# Patient Record
Sex: Female | Born: 1975 | Race: White | Hispanic: No | Marital: Married | Smoking: Never smoker
Health system: Southern US, Community
[De-identification: ages and names within clinical notes are randomized; demographics above are authoritative.]

## PROBLEM LIST (undated history)

## (undated) DIAGNOSIS — F419 Anxiety disorder, unspecified: Secondary | ICD-10-CM

## (undated) DIAGNOSIS — N809 Endometriosis, unspecified: Secondary | ICD-10-CM

## (undated) DIAGNOSIS — K469 Unspecified abdominal hernia without obstruction or gangrene: Secondary | ICD-10-CM

## (undated) HISTORY — PX: BUNIONECTOMY: SHX129

## (undated) HISTORY — PX: ABDOMINAL HYSTERECTOMY: SHX81

## (undated) HISTORY — DX: Endometriosis, unspecified: N80.9

## (undated) HISTORY — PX: OOPHORECTOMY: SHX86

## (undated) HISTORY — DX: Anxiety disorder, unspecified: F41.9

---

## 2015-11-30 ENCOUNTER — Other Ambulatory Visit: Payer: Self-pay | Admitting: Adult Health

## 2015-11-30 DIAGNOSIS — Z1231 Encounter for screening mammogram for malignant neoplasm of breast: Secondary | ICD-10-CM

## 2015-12-28 ENCOUNTER — Ambulatory Visit: Payer: Self-pay

## 2016-10-31 ENCOUNTER — Emergency Department: Payer: BC Managed Care – PPO

## 2016-10-31 ENCOUNTER — Encounter: Payer: Self-pay | Admitting: *Deleted

## 2016-10-31 ENCOUNTER — Observation Stay
Admission: EM | Admit: 2016-10-31 | Discharge: 2016-11-04 | Disposition: A | Discharge status: Home / Self Care | Payer: BC Managed Care – PPO | Attending: Internal Medicine | Admitting: Internal Medicine

## 2016-10-31 DIAGNOSIS — Z842 Family history of other diseases of the genitourinary system: Secondary | ICD-10-CM | POA: Diagnosis not present

## 2016-10-31 DIAGNOSIS — N83209 Unspecified ovarian cyst, unspecified side: Secondary | ICD-10-CM

## 2016-10-31 DIAGNOSIS — N83202 Unspecified ovarian cyst, left side: Secondary | ICD-10-CM | POA: Diagnosis not present

## 2016-10-31 DIAGNOSIS — D72829 Elevated white blood cell count, unspecified: Secondary | ICD-10-CM | POA: Diagnosis present

## 2016-10-31 DIAGNOSIS — N938 Other specified abnormal uterine and vaginal bleeding: Secondary | ICD-10-CM | POA: Insufficient documentation

## 2016-10-31 DIAGNOSIS — B9689 Other specified bacterial agents as the cause of diseases classified elsewhere: Secondary | ICD-10-CM | POA: Diagnosis not present

## 2016-10-31 DIAGNOSIS — D251 Intramural leiomyoma of uterus: Secondary | ICD-10-CM | POA: Diagnosis not present

## 2016-10-31 DIAGNOSIS — R1031 Right lower quadrant pain: Principal | ICD-10-CM | POA: Insufficient documentation

## 2016-10-31 DIAGNOSIS — Z91048 Other nonmedicinal substance allergy status: Secondary | ICD-10-CM | POA: Insufficient documentation

## 2016-10-31 DIAGNOSIS — E876 Hypokalemia: Secondary | ICD-10-CM | POA: Insufficient documentation

## 2016-10-31 DIAGNOSIS — N760 Acute vaginitis: Secondary | ICD-10-CM | POA: Insufficient documentation

## 2016-10-31 DIAGNOSIS — R935 Abnormal findings on diagnostic imaging of other abdominal regions, including retroperitoneum: Secondary | ICD-10-CM

## 2016-10-31 DIAGNOSIS — K589 Irritable bowel syndrome without diarrhea: Secondary | ICD-10-CM | POA: Diagnosis not present

## 2016-10-31 DIAGNOSIS — Z8719 Personal history of other diseases of the digestive system: Secondary | ICD-10-CM | POA: Insufficient documentation

## 2016-10-31 DIAGNOSIS — Z8659 Personal history of other mental and behavioral disorders: Secondary | ICD-10-CM | POA: Diagnosis not present

## 2016-10-31 DIAGNOSIS — Z8249 Family history of ischemic heart disease and other diseases of the circulatory system: Secondary | ICD-10-CM | POA: Insufficient documentation

## 2016-10-31 DIAGNOSIS — Z9889 Other specified postprocedural states: Secondary | ICD-10-CM | POA: Diagnosis not present

## 2016-10-31 DIAGNOSIS — R109 Unspecified abdominal pain: Secondary | ICD-10-CM | POA: Diagnosis present

## 2016-10-31 DIAGNOSIS — R933 Abnormal findings on diagnostic imaging of other parts of digestive tract: Secondary | ICD-10-CM

## 2016-10-31 DIAGNOSIS — Z803 Family history of malignant neoplasm of breast: Secondary | ICD-10-CM | POA: Diagnosis not present

## 2016-10-31 LAB — URINALYSIS, COMPLETE (UACMP) WITH MICROSCOPIC
BACTERIA UA: NONE SEEN
BILIRUBIN URINE: NEGATIVE
Glucose, UA: NEGATIVE mg/dL
Ketones, ur: 20 mg/dL — AB
LEUKOCYTES UA: NEGATIVE
NITRITE: NEGATIVE
PH: 5 (ref 5.0–8.0)
Protein, ur: 30 mg/dL — AB
SPECIFIC GRAVITY, URINE: 1.018 (ref 1.005–1.030)

## 2016-10-31 LAB — COMPREHENSIVE METABOLIC PANEL
ALT: 19 U/L (ref 14–54)
AST: 15 U/L (ref 15–41)
Albumin: 4 g/dL (ref 3.5–5.0)
Alkaline Phosphatase: 33 U/L — ABNORMAL LOW (ref 38–126)
Anion gap: 6 (ref 5–15)
BUN: 9 mg/dL (ref 6–20)
CHLORIDE: 107 mmol/L (ref 101–111)
CO2: 24 mmol/L (ref 22–32)
CREATININE: 0.52 mg/dL (ref 0.44–1.00)
Calcium: 8.6 mg/dL — ABNORMAL LOW (ref 8.9–10.3)
GFR calc Af Amer: >60 mL/min (ref >60)
GFR calc non Af Amer: >60 mL/min (ref >60)
Glucose, Bld: 116 mg/dL — ABNORMAL HIGH (ref 65–99)
Potassium: 3.4 mmol/L — ABNORMAL LOW (ref 3.5–5.1)
Sodium: 137 mmol/L (ref 135–145)
Total Bilirubin: 1.3 mg/dL — ABNORMAL HIGH (ref 0.3–1.2)
Total Protein: 6.1 g/dL — ABNORMAL LOW (ref 6.5–8.1)

## 2016-10-31 LAB — WET PREP, GENITAL
Sperm: NONE SEEN
TRICH WET PREP: NONE SEEN
YEAST WET PREP: NONE SEEN

## 2016-10-31 LAB — CBC
HEMATOCRIT: 39.4 % (ref 35.0–47.0)
Hemoglobin: 13.9 g/dL (ref 12.0–16.0)
MCH: 31.7 pg (ref 26.0–34.0)
MCHC: 35.2 g/dL (ref 32.0–36.0)
MCV: 90.1 fL (ref 80.0–100.0)
Platelets: 270 10*3/uL (ref 150–440)
RBC: 4.37 MIL/uL (ref 3.80–5.20)
RDW: 12.3 % (ref 11.5–14.5)
WBC: 18.3 10*3/uL — AB (ref 3.6–11.0)

## 2016-10-31 LAB — CHLAMYDIA/NGC RT PCR (ARMC ONLY)
Chlamydia Tr: NOT DETECTED
N GONORRHOEAE: NOT DETECTED

## 2016-10-31 LAB — LIPASE, BLOOD: LIPASE: 15 U/L (ref 11–51)

## 2016-10-31 LAB — PREGNANCY, URINE: PREG TEST UR: NEGATIVE

## 2016-10-31 LAB — TSH: TSH: 0.894 u[IU]/mL (ref 0.350–4.500)

## 2016-10-31 MED ORDER — MORPHINE SULFATE (PF) 4 MG/ML IV SOLN
4.0000 mg | INTRAVENOUS | Status: DC | PRN
  Administered 2016-10-31 – 2016-11-01 (×3): 4 mg via INTRAVENOUS
  Filled 2016-10-31 (×3): qty 1

## 2016-10-31 MED ORDER — ONDANSETRON HCL 4 MG/2ML IJ SOLN
4.0000 mg | Freq: Once | INTRAMUSCULAR | Status: AC
  Administered 2016-10-31: 4 mg via INTRAVENOUS
  Filled 2016-10-31: qty 2

## 2016-10-31 MED ORDER — METRONIDAZOLE 500 MG PO TABS
500.0000 mg | ORAL_TABLET | Freq: Two times a day (BID) | ORAL | Status: DC
  Administered 2016-11-01: 500 mg via ORAL
  Filled 2016-10-31 (×3): qty 1

## 2016-10-31 MED ORDER — ACETAMINOPHEN 325 MG PO TABS
650.0000 mg | ORAL_TABLET | Freq: Four times a day (QID) | ORAL | Status: DC | PRN
  Administered 2016-11-01: 650 mg via ORAL
  Filled 2016-10-31: qty 2

## 2016-10-31 MED ORDER — IOPAMIDOL (ISOVUE-300) INJECTION 61%
100.0000 mL | Freq: Once | INTRAVENOUS | Status: DC | PRN
  Filled 2016-10-31: qty 100

## 2016-10-31 MED ORDER — FENTANYL CITRATE (PF) 100 MCG/2ML IJ SOLN
100.0000 ug | INTRAMUSCULAR | Status: DC | PRN
  Administered 2016-10-31: 100 ug via INTRAVENOUS
  Filled 2016-10-31: qty 2

## 2016-10-31 MED ORDER — PROMETHAZINE HCL 25 MG/ML IJ SOLN
12.5000 mg | Freq: Four times a day (QID) | INTRAMUSCULAR | Status: DC | PRN
  Administered 2016-10-31: 12.5 mg via INTRAVENOUS

## 2016-10-31 MED ORDER — METRONIDAZOLE 500 MG PO TABS: 500.0000 mg | ORAL_TABLET | Freq: Two times a day (BID) | ORAL | 0 refills | Status: DC

## 2016-10-31 MED ORDER — ONDANSETRON HCL 4 MG PO TABS: 4.0000 mg | ORAL_TABLET | Freq: Four times a day (QID) | ORAL | Status: DC | PRN

## 2016-10-31 MED ORDER — ONDANSETRON HCL 4 MG/2ML IJ SOLN
4.0000 mg | Freq: Four times a day (QID) | INTRAMUSCULAR | Status: DC | PRN
  Administered 2016-11-01 – 2016-11-03 (×2): 4 mg via INTRAVENOUS
  Filled 2016-10-31 (×2): qty 2

## 2016-10-31 MED ORDER — PROMETHAZINE HCL 25 MG/ML IJ SOLN
12.5000 mg | Freq: Four times a day (QID) | INTRAMUSCULAR | Status: DC | PRN
  Administered 2016-10-31: 12.5 mg via INTRAVENOUS
  Filled 2016-10-31 (×2): qty 1

## 2016-10-31 MED ORDER — ACETAMINOPHEN 650 MG RE SUPP
650.0000 mg | Freq: Four times a day (QID) | RECTAL | Status: DC | PRN
Start: 2016-10-31 — End: 2016-11-04

## 2016-10-31 MED ORDER — SODIUM CHLORIDE 0.9 % IV BOLUS (SEPSIS)
1000.0000 mL | Freq: Once | INTRAVENOUS | Status: AC
  Administered 2016-10-31: 1000 mL via INTRAVENOUS

## 2016-10-31 MED ORDER — MESALAMINE ER 250 MG PO CPCR
1000.0000 mg | ORAL_CAPSULE | Freq: Four times a day (QID) | ORAL | Status: DC
  Filled 2016-10-31 (×3): qty 4

## 2016-10-31 MED ORDER — DOCUSATE SODIUM 100 MG PO CAPS
100.0000 mg | ORAL_CAPSULE | Freq: Two times a day (BID) | ORAL | Status: DC
  Administered 2016-11-01: 100 mg via ORAL
  Filled 2016-10-31 (×5): qty 1

## 2016-10-31 MED ORDER — HYDROCODONE-ACETAMINOPHEN 5-325 MG PO TABS: 1.0000 | ORAL_TABLET | ORAL | 0 refills | Status: DC | PRN

## 2016-10-31 MED ORDER — PROMETHAZINE HCL 12.5 MG PO TABS: 12.5000 mg | ORAL_TABLET | Freq: Four times a day (QID) | ORAL | 0 refills | Status: DC | PRN

## 2016-10-31 NOTE — ED Triage Notes (Signed)
Pt states lower abd pain that radiates to her back that began today, denies any nausea or vomiting or problems with BM, states she is currently on her menstrual cycle

## 2016-10-31 NOTE — ED Notes (Signed)
ED Provider at bedside. 

## 2016-10-31 NOTE — ED Provider Notes (Addendum)
[facility] Emergency Department Provider Note    None    (approximate)  I have reviewed the triage vital signs and the nursing notes.   HISTORY  Chief Complaint Abdominal Pain    HPI Shelly Middleton is a 41 y.o. female presents with chief complaint of sudden onset right lower quadrant stabbing pain. Also describes a pressure. Denies any shortness of breath or chest pain. No nausea or vomiting. Did take 1 ibuprofen earlier in the day and denies any improvement in symptoms. Has never had pain like this before. No previous surgeries. Is currently on her menstrual cycle.  Denies any chance of being pregnant. No previous history of cysts. No vaginal discharge but is having some abnormal vaginal bleeding.   History reviewed. No pertinent past medical history. History reviewed. No pertinent family history. History reviewed. No pertinent surgical history. There are no active problems to display for this patient.     Prior to Admission medications   Medication Sig Start Date End Date Taking? Authorizing Provider  HYDROcodone-acetaminophen (NORCO) 5-325 MG tablet Take 1 tablet by mouth every 4 (four) hours as needed for moderate pain. 10/31/16   Merlyn Lot, MD  metroNIDAZOLE (FLAGYL) 500 MG tablet Take 1 tablet (500 mg total) by mouth 2 (two) times daily. 10/31/16 11/07/16  Merlyn Lot, MD  promethazine (PHENERGAN) 12.5 MG tablet Take 1 tablet (12.5 mg total) by mouth every 6 (six) hours as needed for nausea or vomiting. 10/31/16   Merlyn Lot, MD    Allergies Patient has no known allergies.    Social History Social History  Substance Use Topics  . Smoking status: Never Smoker  . Smokeless tobacco: Never Used  . Alcohol use No    Review of Systems Patient denies headaches, rhinorrhea, blurry vision, numbness, shortness of breath, chest pain, edema, cough, abdominal pain, nausea, vomiting, diarrhea, dysuria, fevers, rashes or hallucinations  unless otherwise stated above in HPI. ____________________________________________   PHYSICAL EXAM:  VITAL SIGNS: Vitals:   10/31/16 1343  BP: 127/87  Pulse: 89  Resp: 18  Temp: 98.3 F (36.8 C)  SpO2: 100%    Constitutional: Alert and oriented. Well appearing and in no acute distress. Eyes: Conjunctivae are normal.  Head: Atraumatic. Nose: No congestion/rhinnorhea. Mouth/Throat: Mucous membranes are moist.   Neck: No stridor. Painless ROM.  Cardiovascular: Normal rate, regular rhythm. Grossly normal heart sounds.  Good peripheral circulation. Respiratory: Normal respiratory effort.  No retractions. Lungs CTAB. Gastrointestinal: Soft and nontender. No distention. No abdominal bruits. No CVA tenderness. Genitourinary: no cmt, moderate blood in vaginal vault without evidence of purulent discharge Musculoskeletal: No lower extremity tenderness nor edema.  No joint effusions. Neurologic:  Normal speech and language. No gross focal neurologic deficits are appreciated. No facial droop Skin:  Skin is warm, dry and intact. No rash noted. Psychiatric: Mood and affect are normal. Speech and behavior are normal.  ____________________________________________   LABS (all labs ordered are listed, but only abnormal results are displayed)  Results for orders placed or performed during the hospital encounter of 10/31/16 (from the past 24 hour(s))  Lipase, blood     Status: None   Collection Time: 10/31/16  1:42 PM  Result Value Ref Range   Lipase 15 11 - 51 U/L  Comprehensive metabolic panel     Status: Abnormal   Collection Time: 10/31/16  1:42 PM  Result Value Ref Range   Sodium 137 135 - 145 mmol/L   Potassium 3.4 (L) 3.5 - 5.1 mmol/L  Chloride 107 101 - 111 mmol/L   CO2 24 22 - 32 mmol/L   Glucose, Bld 116 (H) 65 - 99 mg/dL   BUN 9 6 - 20 mg/dL   Creatinine, Ser 0.52 0.44 - 1.00 mg/dL   Calcium 8.6 (L) 8.9 - 10.3 mg/dL   Total Protein 6.1 (L) 6.5 - 8.1 g/dL   Albumin 4.0 3.5  - 5.0 g/dL   AST 15 15 - 41 U/L   ALT 19 14 - 54 U/L   Alkaline Phosphatase 33 (L) 38 - 126 U/L   Total Bilirubin 1.3 (H) 0.3 - 1.2 mg/dL   GFR calc non Af Amer >60 >60 mL/min   GFR calc Af Amer >60 >60 mL/min   Anion gap 6 5 - 15  CBC     Status: Abnormal   Collection Time: 10/31/16  1:42 PM  Result Value Ref Range   WBC 18.3 (H) 3.6 - 11.0 K/uL   RBC 4.37 3.80 - 5.20 MIL/uL   Hemoglobin 13.9 12.0 - 16.0 g/dL   HCT 39.4 35.0 - 47.0 %   MCV 90.1 80.0 - 100.0 fL   MCH 31.7 26.0 - 34.0 pg   MCHC 35.2 32.0 - 36.0 g/dL   RDW 12.3 11.5 - 14.5 %   Platelets 270 150 - 440 K/uL  Urinalysis, Complete w Microscopic     Status: Abnormal   Collection Time: 10/31/16  1:42 PM  Result Value Ref Range   Color, Urine YELLOW (A) YELLOW   APPearance HAZY (A) CLEAR   Specific Gravity, Urine 1.018 1.005 - 1.030   pH 5.0 5.0 - 8.0   Glucose, UA NEGATIVE NEGATIVE mg/dL   Hgb urine dipstick LARGE (A) NEGATIVE   Bilirubin Urine NEGATIVE NEGATIVE   Ketones, ur 20 (A) NEGATIVE mg/dL   Protein, ur 30 (A) NEGATIVE mg/dL   Nitrite NEGATIVE NEGATIVE   Leukocytes, UA NEGATIVE NEGATIVE   RBC / HPF TOO NUMEROUS TO COUNT 0 - 5 RBC/hpf   WBC, UA 0-5 0 - 5 WBC/hpf   Bacteria, UA NONE SEEN NONE SEEN   Squamous Epithelial / LPF 0-5 (A) NONE SEEN   Mucus PRESENT   Pregnancy, urine     Status: None   Collection Time: 10/31/16  1:42 PM  Result Value Ref Range   Preg Test, Ur NEGATIVE NEGATIVE  Chlamydia/NGC rt PCR ([facility] only)     Status: None   Collection Time: 10/31/16  5:13 PM  Result Value Ref Range   Specimen source GC/Chlam ENDOCERVICAL    Chlamydia Tr NOT DETECTED NOT DETECTED   N gonorrhoeae NOT DETECTED NOT DETECTED  Wet prep, genital     Status: Abnormal   Collection Time: 10/31/16  5:13 PM  Result Value Ref Range   Yeast Wet Prep HPF POC NONE SEEN NONE SEEN   Trich, Wet Prep NONE SEEN NONE SEEN   Clue Cells Wet Prep HPF POC PRESENT (A) NONE SEEN   WBC, Wet Prep HPF POC MODERATE (A) NONE SEEN     Sperm NONE SEEN    ____________________________________________ ____________________________________________  RADIOLOGY I personally reviewed all radiographic images ordered to evaluate for the above acute complaints and reviewed radiology reports and findings.  These findings were personally discussed with the patient.  Please see medical record for radiology report.  ____________________________________________   PROCEDURES  Procedure(s) performed:  Procedures    Critical Care performed: no ____________________________________________   INITIAL IMPRESSION / ASSESSMENT AND PLAN / ED COURSE  Pertinent labs & imaging  results that were available during my care of the patient were reviewed by me and considered in my medical decision making (see chart for details).  DDX: appendicitis, uti, torsion, cyst, diverticulitis, msk strain, stone  Klarisa Ruggieri is a 41 y.o. who presents to the ED with abdominal pain as described above. CT imaging ordered for the above differential she does have a mild leukocytosis. Her blood work is otherwise appropriate and the patient is otherwise hemodynamic stable. Patient given IV fluids as well as IV pain medication. CT imaging shows no evidence of acute appendicitis. A spoke on the phone with radiologist regarding the findings and the appendix does appear normal however there is a small area of small bowel inflammation without evidence of perforation or abscess. Based on the abnormalities and adnexal area will perform pelvic exam and ultrasound to further evaluate for ovarian pathology.  Clinical Course as of Nov 01 1838  Wed Oct 31, 2016  2952 ultrasound shows no evidence of torsion. Discussed results of ultrasound and CT imaging) with the patient. There is no evidence of appendicitis.  however I am concerned for amount of fluid and inflammation in her small bowel and sigmoid colon. A spoke with general surgery, Dr. Adonis Huguenin, who is kindly agreed to  consult on the patient. Again there does not appear to be acute appendicitis but based on her symptoms and probable new onset inflammatory bowel disease or do believe that she would benefit from admission for medical workup with GI consultation.    Clinical Course User Index [PR] Merlyn Lot, MD     ____________________________________________   FINAL CLINICAL IMPRESSION(S) / ED DIAGNOSES  Final diagnoses:  Abdominal pain, RLQ  Cyst of ovary, unspecified laterality  Leukocytosis, unspecified type      NEW MEDICATIONS STARTED DURING THIS VISIT:  New Prescriptions   HYDROCODONE-ACETAMINOPHEN (NORCO) 5-325 MG TABLET    Take 1 tablet by mouth every 4 (four) hours as needed for moderate pain.   METRONIDAZOLE (FLAGYL) 500 MG TABLET    Take 1 tablet (500 mg total) by mouth 2 (two) times daily.   PROMETHAZINE (PHENERGAN) 12.5 MG TABLET    Take 1 tablet (12.5 mg total) by mouth every 6 (six) hours as needed for nausea or vomiting.     Note:  This document was prepared using Dragon voice recognition software and may include unintentional dictation errors.    Merlyn Lot, MD 10/31/16 Ward Chatters    Merlyn Lot, MD 10/31/16 Despina Pole

## 2016-10-31 NOTE — ED Notes (Signed)
Admission MD at bedside.  

## 2016-10-31 NOTE — Consult Note (Signed)
Patient ID: Shelly Middleton, female   DOB: 07-29-1975, 41 y.o.   MRN: [id number]  CC: Abdominal pain  HPI Shelly Middleton is a 41 y.o. female who presents to the ER for evaluation of a 1 day history of abdominal pain. Surgical consultation was requested by Dr. Merlyn Lot of the ER for evaluation of right-sided abdominal pain. Patient reports that the pain is along her entire right side. It is currently a feeling of pressure, dull ache, intermittent stabbing pains. She's never had any pain like this before. She denies any ears, chills, chest pain, shortness of breath, diarrhea, constipation. Her last bowel movement was today and was normal. She had 1 episode of nausea and vomiting while in the ER today. She denies any recent sick contacts, travels, changes in health.  HPI  Past medical history: Anxiety, depression, abnormal Pap smear, blindness of the left eye.  Past surgical history, bunionectomy. No prior abdominal surgeries.  Past family history: High blood pressure in the father and mother: breast cancer in distant relatives including maternal aunt and grandmother no other known cardiac disease or diabetes.  Social History Social History  Substance Use Topics  . Smoking status: Never Smoker  . Smokeless tobacco: Never Used  . Alcohol use No    Allergies  Allergen Reactions  . Tape Rash    Current Facility-Administered Medications  Medication Dose Route Frequency Provider Last Rate Last Dose  . fentaNYL (SUBLIMAZE) injection 100 mcg  100 mcg Intravenous Q1H PRN Merlyn Lot, MD   100 mcg at 10/31/16 1921  . iopamidol (ISOVUE-300) 61 % injection 100 mL  100 mL Intravenous Once PRN Merlyn Lot, MD      . morphine 4 MG/ML injection 4 mg  4 mg Intravenous Q3H PRN Merlyn Lot, MD   4 mg at 10/31/16 1447  . promethazine (PHENERGAN) injection 12.5 mg  12.5 mg Intravenous Q6H PRN Merlyn Lot, MD   12.5 mg at 10/31/16 1447   Current Outpatient Prescriptions  Medication  Sig Dispense Refill  . HYDROcodone-acetaminophen (NORCO) 5-325 MG tablet Take 1 tablet by mouth every 4 (four) hours as needed for moderate pain. 6 tablet 0  . metroNIDAZOLE (FLAGYL) 500 MG tablet Take 1 tablet (500 mg total) by mouth 2 (two) times daily. 14 tablet 0  . promethazine (PHENERGAN) 12.5 MG tablet Take 1 tablet (12.5 mg total) by mouth every 6 (six) hours as needed for nausea or vomiting. 12 tablet 0     Review of Systems A Multi-point review of systems was asked and was negative except for the findings documented in the history of present illness  Physical Exam Blood pressure 127/87, pulse 89, temperature 98.3 F (36.8 C), temperature source Oral, resp. rate 18, height 5\' 4"  (1.626 m), weight 71.2 kg (157 lb), last menstrual period 10/31/2016, SpO2 100 %. CONSTITUTIONAL: Resting in bed in no acute distress. EYES: Pupils are equal, round, and reactive to light, Sclera are non-icteric. EARS, NOSE, MOUTH AND THROAT: The oropharynx is clear. The oral mucosa is pink and moist. Hearing is intact to voice. LYMPH NODES:  Lymph nodes in the neck are normal. RESPIRATORY:  Lungs are clear. There is normal respiratory effort, with equal breath sounds bilaterally, and without pathologic use of accessory muscles. CARDIOVASCULAR: Heart is regular without murmurs, gallops, or rubs. GI: The abdomen is soft, mildly tender to deep palpation along the entire right abdomen but without a McBurney's point or Rovsing sign, and nondistended. There are no palpable masses. There is no hepatosplenomegaly. There  are normal bowel sounds in all quadrants. GU: Rectal deferred.   MUSCULOSKELETAL: Normal muscle strength and tone. No cyanosis or edema.   SKIN: Turgor is good and there are no pathologic skin lesions or ulcers. NEUROLOGIC: Motor and sensation is grossly normal. Cranial nerves are grossly intact. PSYCH:  Oriented to person, place and time. Affect is normal.  Data Reviewed Images and labs reviewed.  Labs showed leukocytosis of 18.3 as well as a mild hypokalemia 3.4 the labs are otherwise within normal limits. Pelvic ultrasound fails to show any obvious pathology that would explain her symptoms. CT scan of the abdomen shows some minimal free fluid in the right lower quadrant as well as evidence of inflammation to the small bowel and sigmoid colon. No free air or abscess, obstruction. The appendix is visualized away from the free fluid but near the area of inflammation seen on the CT scan I have personally reviewed the patient's imaging, laboratory findings and medical records.    Assessment    Abdominal pain    Plan    41 year old female with new onset abdominal pain, leukocytosis, CT evidence of inflammation in 2 discrete areas of the bowel. No evidence of appendicitis on CT scan or exam. Discussed with the patient and the ER provider that this presentation appears most consistent with new onset inflammatory bowel disease. However also discussed the other possible etiologies with the patient. Given this presentation, it is our recommendation for a hospitalist admission with a GI evaluation to further workup whether not she has inflammatory bowel disease. Discussed with the patient that the surgery team would follow until a diagnosis was confirmed or she was better. She voiced understanding. General surgery will follow along as a consult, recommend continuing IV antibiotic therapy and endoscopic workup.     Time spent with the patient was 50 minutes, with more than 50% of the time spent in face-to-face education, counseling and care coordination.     Shelly Pert, MD FACS General Surgeon 10/31/2016, 8:59 PM

## 2016-10-31 NOTE — ED Notes (Signed)
ED.

## 2016-11-01 ENCOUNTER — Encounter: Payer: Self-pay | Admitting: Internal Medicine

## 2016-11-01 DIAGNOSIS — R1031 Right lower quadrant pain: Secondary | ICD-10-CM

## 2016-11-01 DIAGNOSIS — R933 Abnormal findings on diagnostic imaging of other parts of digestive tract: Secondary | ICD-10-CM | POA: Diagnosis not present

## 2016-11-01 DIAGNOSIS — N760 Acute vaginitis: Secondary | ICD-10-CM

## 2016-11-01 DIAGNOSIS — R935 Abnormal findings on diagnostic imaging of other abdominal regions, including retroperitoneum: Secondary | ICD-10-CM | POA: Diagnosis not present

## 2016-11-01 LAB — CBC WITH DIFFERENTIAL/PLATELET
BASOS PCT: 0 %
Basophils Absolute: 0 10*3/uL (ref 0–0.1)
Eosinophils Absolute: 0.1 10*3/uL (ref 0–0.7)
Eosinophils Relative: 1 %
HEMATOCRIT: 36 % (ref 35.0–47.0)
Hemoglobin: 12.8 g/dL (ref 12.0–16.0)
LYMPHS ABS: 1.5 10*3/uL (ref 1.0–3.6)
LYMPHS PCT: 12 %
MCH: 32.8 pg (ref 26.0–34.0)
MCHC: 35.5 g/dL (ref 32.0–36.0)
MCV: 92.3 fL (ref 80.0–100.0)
MONO ABS: 0.9 10*3/uL (ref 0.2–0.9)
Monocytes Relative: 7 %
NEUTROS ABS: 10.6 10*3/uL — AB (ref 1.4–6.5)
Neutrophils Relative %: 80 %
Platelets: 234 10*3/uL (ref 150–440)
RBC: 3.89 MIL/uL (ref 3.80–5.20)
RDW: 12.4 % (ref 11.5–14.5)
WBC: 13.1 10*3/uL — ABNORMAL HIGH (ref 3.6–11.0)

## 2016-11-01 MED ORDER — SODIUM CHLORIDE 0.9 % IV SOLN
INTRAVENOUS | Status: DC
  Administered 2016-11-01 (×2): via INTRAVENOUS

## 2016-11-01 MED ORDER — METRONIDAZOLE 500 MG PO TABS
500.0000 mg | ORAL_TABLET | Freq: Three times a day (TID) | ORAL | Status: DC
  Administered 2016-11-01 – 2016-11-02 (×2): 500 mg via ORAL
  Filled 2016-11-01 (×4): qty 1

## 2016-11-01 MED ORDER — POLYETHYLENE GLYCOL 3350 17 GM/SCOOP PO POWD
1.0000 | Freq: Once | ORAL | Status: AC
  Administered 2016-11-01: 255 g via ORAL
  Filled 2016-11-01: qty 255

## 2016-11-01 MED ORDER — OXYCODONE HCL 5 MG PO TABS
5.0000 mg | ORAL_TABLET | Freq: Four times a day (QID) | ORAL | Status: DC | PRN
  Administered 2016-11-01 – 2016-11-03 (×5): 5 mg via ORAL
  Filled 2016-11-01 (×5): qty 1

## 2016-11-01 MED ORDER — MORPHINE SULFATE (PF) 2 MG/ML IV SOLN
2.0000 mg | INTRAVENOUS | Status: DC | PRN
  Administered 2016-11-01 – 2016-11-03 (×8): 2 mg via INTRAVENOUS
  Filled 2016-11-01 (×9): qty 1

## 2016-11-01 NOTE — Consult Note (Addendum)
GYNECOLOGY CONSULT NOTE  Reason for Consult: Ovarian Cyst, Fibroid Uterus, Vaginal bleeding Referring Physician: Dr. Loletha Grayer, MD  Shelly Middleton is an 41 y.o. [id number] female who is currently admitted for sudden onset of abdominal pain beginning late yesterday evening.  The pain started abruptly and radiated to her back. Took Ibuprofen without relief.  Presented to the Emergency Room early this morning.  Denies nausea/vomiting, urinary symptoms, vaginal discharge.  She does note that she is currently on her menses.  Consulted due to CT findings of inflamed and dilated loops of bowel, as well as ovarian cyst and uterine fibroids.  Ultrasound confirming presence of uterine and ovarian pathology.    Patient currently receives her GYN care at [facility] Has been seen in the last year.    Pertinent Gynecological History: Menses: flow alternates from month to month, sometimes light, sometimes heavy (using tampon or pad q 2 hrs).  Cycles usually lasts ~ 3 days.  Bleeding: dysfunctional uterine bleeding Contraception: vasectomy Blood transfusions: none Sexually transmitted diseases: no past history Previous GYN Procedures: none Last mammogram: has not had one. Last pap: normal Date: ~ 2 years ago. OB History: G4, P4  (SVD)   Menstrual History: Menarche age: 41 Patient's last menstrual period was 10/31/2016 (approximate).    History reviewed. No pertinent past medical history.  Past Surgical History:  Procedure Laterality Date  . BUNIONECTOMY Bilateral 2002 (left), 2009 (right)    Family History  Problem Relation Age of Onset  . Hypertension Father   . CAD Father   . Breast cancer Maternal Grandmother 78       and possibly 1 or 2 of grandmother's sisters    Social History:  reports that she has never smoked. She has never used smokeless tobacco. She reports that she does not drink alcohol. Her drug history is not on file.  Allergies:  Allergies  Allergen  Reactions  . Tape Rash    Medications:  Prior to Admission:  No prescriptions prior to admission.   Scheduled: . docusate sodium  100 mg Oral BID  . metroNIDAZOLE  500 mg Oral Q8H   Continuous: . sodium chloride 75 mL/hr at 11/01/16 0949    Review of Systems  Constitutional: Positive for fever. Negative for chills, malaise/fatigue and weight loss.  HENT: Negative for congestion and tinnitus.   Eyes: Negative for blurred vision, pain and discharge.  Respiratory: Negative for cough, shortness of breath and wheezing.   Cardiovascular: Negative for chest pain, palpitations and leg swelling.  Gastrointestinal: Positive for abdominal pain. Negative for heartburn, nausea and vomiting.  Genitourinary: Negative for dysuria, flank pain, frequency and urgency.  Musculoskeletal: Negative for back pain, falls and myalgias.  Skin: Negative for itching and rash.  Neurological: Negative for dizziness, seizures and headaches.  Endo/Heme/Allergies: Negative for polydipsia. Does not bruise/bleed easily.  Psychiatric/Behavioral: Negative for depression and substance abuse. The patient is not nervous/anxious.     Blood pressure 112/72, pulse 93, temperature (!) 101.2 F (38.4 C), temperature source Oral, resp. rate 17, height _0  (1.626 m), weight 157 lb (71.2 kg), last menstrual period 10/31/2016, SpO2 97 %. Physical Exam  Constitutional: She is oriented to person, place, and time. She appears well-developed and well-nourished. No distress.  HENT:  Head: Normocephalic and atraumatic.  Nose: Nose normal.  Mouth/Throat: Oropharynx is clear and moist. No oropharyngeal exudate.  Eyes: Conjunctivae and EOM are normal. Right eye exhibits no discharge. Left eye exhibits no discharge. No scleral icterus.  Neck:  Normal range of motion. Neck supple. No thyromegaly present.  Cardiovascular: Normal rate, regular rhythm and normal heart sounds.  Exam reveals no gallop and no friction rub.   No murmur  heard. Respiratory: Effort normal and breath sounds normal. No respiratory distress. She has no wheezes. She has no rales.  GI: Soft. Bowel sounds are normal. She exhibits no distension and no mass. There is tenderness. There is no rebound and no guarding.  Mild tenderness in lower abdomen  Genitourinary:  Genitourinary Comments: Deferred. Patient declined. States she had an exam in the ER.   Musculoskeletal: Normal range of motion. She exhibits no edema, tenderness or deformity.  Neurological: She is alert and oriented to person, place, and time.  Skin: Skin is warm and dry. No rash noted. She is not diaphoretic. No erythema.  Psychiatric: She has a normal mood and affect. Her behavior is normal. Thought content normal.    Results for orders placed or performed during the hospital encounter of 10/31/16 (from the past 48 hour(s))  Lipase, blood     Status: None   Collection Time: 10/31/16  1:42 PM  Result Value Ref Range   Lipase 15 11 - 51 U/L  Comprehensive metabolic panel     Status: Abnormal   Collection Time: 10/31/16  1:42 PM  Result Value Ref Range   Sodium 137 135 - 145 mmol/L   Potassium 3.4 (L) 3.5 - 5.1 mmol/L   Chloride 107 101 - 111 mmol/L   CO2 24 22 - 32 mmol/L   Glucose, Bld 116 (H) 65 - 99 mg/dL   BUN 9 6 - 20 mg/dL   Creatinine, Ser 0.52 0.44 - 1.00 mg/dL   Calcium 8.6 (L) 8.9 - 10.3 mg/dL   Total Protein 6.1 (L) 6.5 - 8.1 g/dL   Albumin 4.0 3.5 - 5.0 g/dL   AST 15 15 - 41 U/L   ALT 19 14 - 54 U/L   Alkaline Phosphatase 33 (L) 38 - 126 U/L   Total Bilirubin 1.3 (H) 0.3 - 1.2 mg/dL   GFR calc non Af Amer >60 >60 mL/min   GFR calc Af Amer >60 >60 mL/min    Comment: (NOTE) The eGFR has been calculated using the CKD EPI equation. This calculation has not been validated in all clinical situations. eGFR's persistently <60 mL/min signify possible Chronic Kidney Disease.    Anion gap 6 5 - 15  CBC     Status: Abnormal   Collection Time: 10/31/16  1:42 PM  Result  Value Ref Range   WBC 18.3 (H) 3.6 - 11.0 K/uL   RBC 4.37 3.80 - 5.20 MIL/uL   Hemoglobin 13.9 12.0 - 16.0 g/dL   HCT 39.4 35.0 - 47.0 %   MCV 90.1 80.0 - 100.0 fL   MCH 31.7 26.0 - 34.0 pg   MCHC 35.2 32.0 - 36.0 g/dL   RDW 12.3 11.5 - 14.5 %   Platelets 270 150 - 440 K/uL  Urinalysis, Complete w Microscopic     Status: Abnormal   Collection Time: 10/31/16  1:42 PM  Result Value Ref Range   Color, Urine YELLOW (A) YELLOW   APPearance HAZY (A) CLEAR   Specific Gravity, Urine 1.018 1.005 - 1.030   pH 5.0 5.0 - 8.0   Glucose, UA NEGATIVE NEGATIVE mg/dL   Hgb urine dipstick LARGE (A) NEGATIVE   Bilirubin Urine NEGATIVE NEGATIVE   Ketones, ur 20 (A) NEGATIVE mg/dL   Protein, ur 30 (A) NEGATIVE mg/dL  Nitrite NEGATIVE NEGATIVE   Leukocytes, UA NEGATIVE NEGATIVE   RBC / HPF TOO NUMEROUS TO COUNT 0 - 5 RBC/hpf   WBC, UA 0-5 0 - 5 WBC/hpf   Bacteria, UA NONE SEEN NONE SEEN   Squamous Epithelial / LPF 0-5 (A) NONE SEEN   Mucus PRESENT   Pregnancy, urine     Status: None   Collection Time: 10/31/16  1:42 PM  Result Value Ref Range   Preg Test, Ur NEGATIVE NEGATIVE  TSH     Status: None   Collection Time: 10/31/16  1:42 PM  Result Value Ref Range   TSH 0.894 0.350 - 4.500 uIU/mL    Comment: Performed by a 3rd Generation assay with a functional sensitivity of <=0.01 uIU/mL.  Chlamydia/NGC rt PCR ([place] only)     Status: None   Collection Time: 10/31/16  5:13 PM  Result Value Ref Range   Specimen source GC/Chlam ENDOCERVICAL    Chlamydia Tr NOT DETECTED NOT DETECTED   N gonorrhoeae NOT DETECTED NOT DETECTED    Comment: (NOTE) 100  This methodology has not been evaluated in pregnant women or in 200  patients with a history of hysterectomy. 300 400  This methodology will not be performed on patients less than 24  years of age.   Wet prep, genital     Status: Abnormal   Collection Time: 10/31/16  5:13 PM  Result Value Ref Range   Yeast Wet Prep HPF POC NONE SEEN NONE SEEN    Trich, Wet Prep NONE SEEN NONE SEEN   Clue Cells Wet Prep HPF POC PRESENT (A) NONE SEEN   WBC, Wet Prep HPF POC MODERATE (A) NONE SEEN   Sperm NONE SEEN   CBC with Differential/Platelet     Status: Abnormal   Collection Time: 11/01/16 12:34 PM  Result Value Ref Range   WBC 13.1 (H) 3.6 - 11.0 K/uL   RBC 3.89 3.80 - 5.20 MIL/uL   Hemoglobin 12.8 12.0 - 16.0 g/dL   HCT 36.0 35.0 - 47.0 %   MCV 92.3 80.0 - 100.0 fL   MCH 32.8 26.0 - 34.0 pg   MCHC 35.5 32.0 - 36.0 g/dL   RDW 12.4 11.5 - 14.5 %   Platelets 234 150 - 440 K/uL   Neutrophils Relative % 80 %   Neutro Abs 10.6 (H) 1.4 - 6.5 K/uL   Lymphocytes Relative 12 %   Lymphs Abs 1.5 1.0 - 3.6 K/uL   Monocytes Relative 7 %   Monocytes Absolute 0.9 0.2 - 0.9 K/uL   Eosinophils Relative 1 %   Eosinophils Absolute 0.1 0 - 0.7 K/uL   Basophils Relative 0 %   Basophils Absolute 0.0 0 - 0.1 K/uL    US Pelvis Transvanginal Non-ob (tv Only)  Result Date: 10/31/2016 CLINICAL DATA:  RIGHT lower quadrant pain. Assess for cyst, mass or perforation. History of inflammatory bowel disease. EXAM: TRANSABDOMINAL AND TRANSVAGINAL ULTRASOUND OF PELVIS DOPPLER ULTRASOUND OF OVARIES TECHNIQUE: Both transabdominal and transvaginal ultrasound examinations of the pelvis were performed. Transabdominal technique was performed for global imaging of the pelvis including uterus, ovaries, adnexal regions, and pelvic cul-de-sac. It was necessary to proceed with endovaginal exam following the transabdominal exam to visualize the adnexa. Color and duplex Doppler ultrasound was utilized to evaluate blood flow to the ovaries. COMPARISON:  CT abdomen and pelvis October 31, 2016 at 1525 hours FINDINGS: Uterus Measurements: 8 x 4.5 x 6.4 cm. Hypoechoic 1.5 and 1.2 cm intramural uterine fundal leiomyomas. Endometrium  Thickness: 8 mm.  No focal abnormality visualized. Right ovary Measurements: 4 x 3.4 x 3.3 cm. Two predominately echogenic masses with shading and acoustic  enhancement measuring to 2.4 cm RIGHT ovary, no solid components/vascularity. Left ovary Measurements: 6.8 x 4.2 x 4.3 cm. Anechoic 3.9 x 2.5 x 3.1 cm avascular cystic mass with internal echoes, acoustic enhancement. Adjacent 2.4 x 2.0 x 1.9 cm similar sonographic appearing cystic mass. Pulsed Doppler evaluation of both ovaries demonstrates normal low-resistance arterial and venous waveforms. Other findings No abnormal free fluid. IMPRESSION: 1. Two RIGHT adnexal endometriomas, less likely hemorrhagic cysts measuring to 2.4 cm. 2. Two LEFT adnexal cysts with debris measuring to 3.9 cm, indeterminate but probably benign. 3. Two intramural leiomyomas measuring to 1.5 cm. 4. Recommend follow-up pelvic ultrasound in 6-12 weeks. This recommendation follows the consensus statement: Management of Asymptomatic Ovarian and Other Adnexal Cysts Imaged at [place]: Society of Radiologists in [place]. Radiology 2010; [phone]. Electronically Signed   By: Elon Alas M.D.   On: 10/31/2016 18:24   US Pelvis Complete  Result Date: 10/31/2016 CLINICAL DATA:  RIGHT lower quadrant pain. Assess for cyst, mass or perforation. History of inflammatory bowel disease. EXAM: TRANSABDOMINAL AND TRANSVAGINAL ULTRASOUND OF PELVIS DOPPLER ULTRASOUND OF OVARIES TECHNIQUE: Both transabdominal and transvaginal ultrasound examinations of the pelvis were performed. Transabdominal technique was performed for global imaging of the pelvis including uterus, ovaries, adnexal regions, and pelvic cul-de-sac. It was necessary to proceed with endovaginal exam following the transabdominal exam to visualize the adnexa. Color and duplex Doppler ultrasound was utilized to evaluate blood flow to the ovaries. COMPARISON:  CT abdomen and pelvis October 31, 2016 at 1525 hours FINDINGS: Uterus Measurements: 8 x 4.5 x 6.4 cm. Hypoechoic 1.5 and 1.2 cm intramural uterine fundal leiomyomas. Endometrium Thickness: 8 mm.  No focal  abnormality visualized. Right ovary Measurements: 4 x 3.4 x 3.3 cm. Two predominately echogenic masses with shading and acoustic enhancement measuring to 2.4 cm RIGHT ovary, no solid components/vascularity. Left ovary Measurements: 6.8 x 4.2 x 4.3 cm. Anechoic 3.9 x 2.5 x 3.1 cm avascular cystic mass with internal echoes, acoustic enhancement. Adjacent 2.4 x 2.0 x 1.9 cm similar sonographic appearing cystic mass. Pulsed Doppler evaluation of both ovaries demonstrates normal low-resistance arterial and venous waveforms. Other findings No abnormal free fluid. IMPRESSION: 1. Two RIGHT adnexal endometriomas, less likely hemorrhagic cysts measuring to 2.4 cm. 2. Two LEFT adnexal cysts with debris measuring to 3.9 cm, indeterminate but probably benign. 3. Two intramural leiomyomas measuring to 1.5 cm. 4. Recommend follow-up pelvic ultrasound in 6-12 weeks. This recommendation follows the consensus statement: Management of Asymptomatic Ovarian and Other Adnexal Cysts Imaged at [place]: Society of Radiologists in [place]. Radiology 2010; [phone]. Electronically Signed   By: Elon Alas M.D.   On: 10/31/2016 18:24   Ct Abdomen Pelvis W Contrast  Result Date: 10/31/2016 CLINICAL DATA:  Lower abdominal pain. History of ulcerative colitis. EXAM: CT ABDOMEN AND PELVIS WITH CONTRAST TECHNIQUE: Multidetector CT imaging of the abdomen and pelvis was performed using the standard protocol following bolus administration of intravenous contrast. CONTRAST:  <See Chart> ISOVUE-300 IOPAMIDOL (ISOVUE-300) INJECTION 61% COMPARISON:  None. FINDINGS: Lower chest: No acute abnormality. Hepatobiliary: No focal liver abnormality is seen. No gallstones, gallbladder wall thickening, or biliary dilatation. Pancreas: Unremarkable. No pancreatic ductal dilatation or surrounding inflammatory changes. Spleen: Normal in size without focal abnormality. Adrenals/Urinary Tract: The adrenal glands appear normal. 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 Normal appearance of the right kidney. Urinary bladder appears normal. Stomach/Bowel: The stomach appears normal. There is no abnormal dilatation of the small bowel loops. Although no wall thickening is identified involving the right lower quadrant small bowel loops there is hyperemia of the right lower quadrant small bowel loop Vasa recta suggesting underlying inflammation. There is also mild wall thickening involving the sigmoid colon. Vascular/Lymphatic: Normal appearance of the abdominal aorta. No enlarged retroperitoneal or mesenteric adenopathy. No enlarged pelvic or inguinal lymph nodes. Reproductive: The uterus contains several small enhancing fibroids. The largest is identified anteriorly measuring 1.5 cm, image 72 of series 2. Bilateral cystic lesions are noted within the adnexal regions. The largest is on the left measuring 3.5 cm. Other: There is a small amount of free fluid within the right lower quadrant of the abdomen, image 60 of series 2. Musculoskeletal: No aggressive lytic or sclerotic bone lesions. IMPRESSION: 1. Suspect mild inflammation involving the right lower quadrant small bowel loops and sigmoid colon. Findings may be secondary to patient's known inflammatory bowel disease. Free fluid is identified within the abdomen. No evidence for bowel obstruction or bowel perforation. 2. Bilateral cystic within the adnexal regions are identified and may be ovarian in origin. Given the mild inflammatory changes within the abdomen and pelvis further evaluation with pelvic sonogram to exclude the possibility of underlying pelvic inflammatory disease and/or tubo-ovarian abscess. Electronically Signed   By: Kerby Moors M.D.   On: 10/31/2016 15:59   US Pelvic Doppler (torsion R/o Or Mass Arterial Flow)  Result Date: 10/31/2016 CLINICAL DATA:  RIGHT lower quadrant pain. Assess for cyst, mass or perforation.  History of inflammatory bowel disease. EXAM: TRANSABDOMINAL AND TRANSVAGINAL ULTRASOUND OF PELVIS DOPPLER ULTRASOUND OF OVARIES TECHNIQUE: Both transabdominal and transvaginal ultrasound examinations of the pelvis were performed. Transabdominal technique was performed for global imaging of the pelvis including uterus, ovaries, adnexal regions, and pelvic cul-de-sac. It was necessary to proceed with endovaginal exam following the transabdominal exam to visualize the adnexa. Color and duplex Doppler ultrasound was utilized to evaluate blood flow to the ovaries. COMPARISON:  CT abdomen and pelvis October 31, 2016 at 1525 hours FINDINGS: Uterus Measurements: 8 x 4.5 x 6.4 cm. Hypoechoic 1.5 and 1.2 cm intramural uterine fundal leiomyomas. Endometrium Thickness: 8 mm.  No focal abnormality visualized. Right ovary Measurements: 4 x 3.4 x 3.3 cm. Two predominately echogenic masses with shading and acoustic enhancement measuring to 2.4 cm RIGHT ovary, no solid components/vascularity. Left ovary Measurements: 6.8 x 4.2 x 4.3 cm. Anechoic 3.9 x 2.5 x 3.1 cm avascular cystic mass with internal echoes, acoustic enhancement. Adjacent 2.4 x 2.0 x 1.9 cm similar sonographic appearing cystic mass. Pulsed Doppler evaluation of both ovaries demonstrates normal low-resistance arterial and venous waveforms. Other findings No abnormal free fluid. IMPRESSION: 1. Two RIGHT adnexal endometriomas, less likely hemorrhagic cysts measuring to 2.4 cm. 2. Two LEFT adnexal cysts with debris measuring to 3.9 cm, indeterminate but probably benign. 3. Two intramural leiomyomas measuring to 1.5 cm. 4. Recommend follow-up pelvic ultrasound in 6-12 weeks. This recommendation follows the consensus statement: Management of Asymptomatic Ovarian and Other Adnexal Cysts Imaged at [place]: Society of Radiologists in [place]. Radiology 2010; [phone]. Electronically Signed   By: Elon Alas M.D.   On: 10/31/2016 18:24     Assessment/Plan: - Diffuse lower abdominal pain with inflammatory findings on CT scan.  Not likely GYN in nature, as small ovarian  cysts (likely endometrioma) not likely to cause diffuse inflammation.  Fibroids are small in nature.  May cause cycle flow changes, but otherwise likely to be asymptomatic. Patient notes cycles have always been like this and usually can deal with them as they only last 3 days.  Also pending GI consult.  - Ovarian cyst can cause some mild abdominal pain symptoms. Discussed likely resolution with expectant management, but can also treat with 4-6 weeks of a daily OCP treatment with follow up ultrasound after treatment for pain symptoms.  Would recommend Junel 1-20 mg pills.  - Bacterial vaginosis - discussed diagnosis.  Patient has been initiated on Flagyl PO BID for treatment.   Will sign off for now.  Patient can f/u in 1-2 weeks in my office once discharged.  Please feel free to re-consult if any new developments occur related to realm of GYN.     Rubie Maid, MD [facility] Cell/pager: [phone]

## 2016-11-01 NOTE — Progress Notes (Signed)
11/01/2016  Subjective: Patient admitted yesterday with abdominal pain in different areas in the abdomen of inflammation including the small bowel loops as well as sigmoid colon. There is concern of possible IBD, however there is also concern for possible hemorrhagic ovarian cyst. Reports this morning she still having abdominal pain both on the left and right side with worse on the right side.  Vital signs: Temp:  [98.3 F (36.8 C)-101.2 F (38.4 C)] 101.2 F (38.4 C) (09/06 1238) Pulse Rate:  [81-93] 93 (09/06 1238) Resp:  [17-18] 17 (09/06 0750) BP: (101-113)/(63-72) 112/72 (09/06 1238) SpO2:  [96 %-99 %] 97 % (09/06 1238)   Intake/Output: No intake/output data recorded. Last BM Date: 10/31/16  Physical Exam: Constitutional: No acute distress Abdomen:  Soft, nondistended, with mild tenderness to palpation in the right lower quadrant as well as the left side. There are no peritoneal signs. This pain is not too concerning  Labs:   Recent Labs  10/31/16 1342 11/01/16 1234  WBC 18.3* 13.1*  HGB 13.9 12.8  HCT 39.4 36.0  PLT 270 234    Recent Labs  10/31/16 1342  NA 137  K 3.4*  CL 107  CO2 24  GLUCOSE 116*  BUN 9  CREATININE 0.52  CALCIUM 8.6*   No results for input(s): LABPROT, INR in the last 72 hours.  Imaging: US Pelvis Transvanginal Non-ob (tv Only)  Result Date: 10/31/2016 CLINICAL DATA:  RIGHT lower quadrant pain. Assess for cyst, mass or perforation. History of inflammatory bowel disease. EXAM: TRANSABDOMINAL AND TRANSVAGINAL ULTRASOUND OF PELVIS DOPPLER ULTRASOUND OF OVARIES TECHNIQUE: Both transabdominal and transvaginal ultrasound examinations of the pelvis were performed. Transabdominal technique was performed for global imaging of the pelvis including uterus, ovaries, adnexal regions, and pelvic cul-de-sac. It was necessary to proceed with endovaginal exam following the transabdominal exam to visualize the adnexa. Color and duplex Doppler ultrasound was  utilized to evaluate blood flow to the ovaries. COMPARISON:  CT abdomen and pelvis October 31, 2016 at 1525 hours FINDINGS: Uterus Measurements: 8 x 4.5 x 6.4 cm. Hypoechoic 1.5 and 1.2 cm intramural uterine fundal leiomyomas. Endometrium Thickness: 8 mm.  No focal abnormality visualized. Right ovary Measurements: 4 x 3.4 x 3.3 cm. Two predominately echogenic masses with shading and acoustic enhancement measuring to 2.4 cm RIGHT ovary, no solid components/vascularity. Left ovary Measurements: 6.8 x 4.2 x 4.3 cm. Anechoic 3.9 x 2.5 x 3.1 cm avascular cystic mass with internal echoes, acoustic enhancement. Adjacent 2.4 x 2.0 x 1.9 cm similar sonographic appearing cystic mass. Pulsed Doppler evaluation of both ovaries demonstrates normal low-resistance arterial and venous waveforms. Other findings No abnormal free fluid. IMPRESSION: 1. Two RIGHT adnexal endometriomas, less likely hemorrhagic cysts measuring to 2.4 cm. 2. Two LEFT adnexal cysts with debris measuring to 3.9 cm, indeterminate but probably benign. 3. Two intramural leiomyomas measuring to 1.5 cm. 4. Recommend follow-up pelvic ultrasound in 6-12 weeks. This recommendation follows the consensus statement: Management of Asymptomatic Ovarian and Other Adnexal Cysts Imaged at [place]: Society of Radiologists in [place]. Radiology 2010; [phone]. Electronically Signed   By: Elon Alas M.D.   On: 10/31/2016 18:24   US Pelvis Complete  Result Date: 10/31/2016 CLINICAL DATA:  RIGHT lower quadrant pain. Assess for cyst, mass or perforation. History of inflammatory bowel disease. EXAM: TRANSABDOMINAL AND TRANSVAGINAL ULTRASOUND OF PELVIS DOPPLER ULTRASOUND OF OVARIES TECHNIQUE: Both transabdominal and transvaginal ultrasound examinations of the pelvis were performed. Transabdominal technique was performed for global imaging of the pelvis  including uterus, ovaries, adnexal regions, and pelvic cul-de-sac. It was necessary to  proceed with endovaginal exam following the transabdominal exam to visualize the adnexa. Color and duplex Doppler ultrasound was utilized to evaluate blood flow to the ovaries. COMPARISON:  CT abdomen and pelvis October 31, 2016 at 1525 hours FINDINGS: Uterus Measurements: 8 x 4.5 x 6.4 cm. Hypoechoic 1.5 and 1.2 cm intramural uterine fundal leiomyomas. Endometrium Thickness: 8 mm.  No focal abnormality visualized. Right ovary Measurements: 4 x 3.4 x 3.3 cm. Two predominately echogenic masses with shading and acoustic enhancement measuring to 2.4 cm RIGHT ovary, no solid components/vascularity. Left ovary Measurements: 6.8 x 4.2 x 4.3 cm. Anechoic 3.9 x 2.5 x 3.1 cm avascular cystic mass with internal echoes, acoustic enhancement. Adjacent 2.4 x 2.0 x 1.9 cm similar sonographic appearing cystic mass. Pulsed Doppler evaluation of both ovaries demonstrates normal low-resistance arterial and venous waveforms. Other findings No abnormal free fluid. IMPRESSION: 1. Two RIGHT adnexal endometriomas, less likely hemorrhagic cysts measuring to 2.4 cm. 2. Two LEFT adnexal cysts with debris measuring to 3.9 cm, indeterminate but probably benign. 3. Two intramural leiomyomas measuring to 1.5 cm. 4. Recommend follow-up pelvic ultrasound in 6-12 weeks. This recommendation follows the consensus statement: Management of Asymptomatic Ovarian and Other Adnexal Cysts Imaged at [place]: Society of Radiologists in [place]. Radiology 2010; [phone]. Electronically Signed   By: Elon Alas M.D.   On: 10/31/2016 18:24   Ct Abdomen Pelvis W Contrast  Result Date: 10/31/2016 CLINICAL DATA:  Lower abdominal pain. History of ulcerative colitis. EXAM: CT ABDOMEN AND PELVIS WITH CONTRAST TECHNIQUE: Multidetector CT imaging of the abdomen and pelvis was performed using the standard protocol following bolus administration of intravenous contrast. CONTRAST:  <See Chart> ISOVUE-300 IOPAMIDOL (ISOVUE-300)  INJECTION 61% COMPARISON:  None. FINDINGS: Lower chest: No acute abnormality. Hepatobiliary: No focal liver abnormality is seen. No gallstones, gallbladder wall thickening, or biliary dilatation. Pancreas: Unremarkable. No pancreatic ductal dilatation or surrounding inflammatory changes. Spleen: Normal in size without focal abnormality. Adrenals/Urinary Tract: The adrenal glands appear normal. 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 Normal appearance of the right kidney. Urinary bladder appears normal. Stomach/Bowel: The stomach appears normal. There is no abnormal dilatation of the small bowel loops. Although no wall thickening is identified involving the right lower quadrant small bowel loops there is hyperemia of the right lower quadrant small bowel loop Vasa recta suggesting underlying inflammation. There is also mild wall thickening involving the sigmoid colon. Vascular/Lymphatic: Normal appearance of the abdominal aorta. No enlarged retroperitoneal or mesenteric adenopathy. No enlarged pelvic or inguinal lymph nodes. Reproductive: The uterus contains several small enhancing fibroids. The largest is identified anteriorly measuring 1.5 cm, image 72 of series 2. Bilateral cystic lesions are noted within the adnexal regions. The largest is on the left measuring 3.5 cm. Other: There is a small amount of free fluid within the right lower quadrant of the abdomen, image 60 of series 2. Musculoskeletal: No aggressive lytic or sclerotic bone lesions. IMPRESSION: 1. Suspect mild inflammation involving the right lower quadrant small bowel loops and sigmoid colon. Findings may be secondary to patient's known inflammatory bowel disease. Free fluid is identified within the abdomen. No evidence for bowel obstruction or bowel perforation. 2. Bilateral cystic within the adnexal regions are identified and may be ovarian in origin. Given the mild inflammatory changes within  the abdomen and pelvis further evaluation with pelvic sonogram to  exclude the possibility of underlying pelvic inflammatory disease and/or tubo-ovarian abscess. Electronically Signed   By: Kerby Moors M.D.   On: 10/31/2016 15:59   US Pelvic Doppler (torsion R/o Or Mass Arterial Flow)  Result Date: 10/31/2016 CLINICAL DATA:  RIGHT lower quadrant pain. Assess for cyst, mass or perforation. History of inflammatory bowel disease. EXAM: TRANSABDOMINAL AND TRANSVAGINAL ULTRASOUND OF PELVIS DOPPLER ULTRASOUND OF OVARIES TECHNIQUE: Both transabdominal and transvaginal ultrasound examinations of the pelvis were performed. Transabdominal technique was performed for global imaging of the pelvis including uterus, ovaries, adnexal regions, and pelvic cul-de-sac. It was necessary to proceed with endovaginal exam following the transabdominal exam to visualize the adnexa. Color and duplex Doppler ultrasound was utilized to evaluate blood flow to the ovaries. COMPARISON:  CT abdomen and pelvis October 31, 2016 at 1525 hours FINDINGS: Uterus Measurements: 8 x 4.5 x 6.4 cm. Hypoechoic 1.5 and 1.2 cm intramural uterine fundal leiomyomas. Endometrium Thickness: 8 mm.  No focal abnormality visualized. Right ovary Measurements: 4 x 3.4 x 3.3 cm. Two predominately echogenic masses with shading and acoustic enhancement measuring to 2.4 cm RIGHT ovary, no solid components/vascularity. Left ovary Measurements: 6.8 x 4.2 x 4.3 cm. Anechoic 3.9 x 2.5 x 3.1 cm avascular cystic mass with internal echoes, acoustic enhancement. Adjacent 2.4 x 2.0 x 1.9 cm similar sonographic appearing cystic mass. Pulsed Doppler evaluation of both ovaries demonstrates normal low-resistance arterial and venous waveforms. Other findings No abnormal free fluid. IMPRESSION: 1. Two RIGHT adnexal endometriomas, less likely hemorrhagic cysts measuring to 2.4 cm. 2. Two LEFT adnexal cysts with debris measuring to 3.9 cm, indeterminate but probably benign. 3. Two  intramural leiomyomas measuring to 1.5 cm. 4. Recommend follow-up pelvic ultrasound in 6-12 weeks. This recommendation follows the consensus statement: Management of Asymptomatic Ovarian and Other Adnexal Cysts Imaged at [place]: Society of Radiologists in [place]. Radiology 2010; [phone]. Electronically Signed   By: Elon Alas M.D.   On: 10/31/2016 18:24    Assessment/Plan: 41 year old female with abdominal pain.  -This does not appear to be appendicitis in etiology. She does have other potential reasons including inflammation the small bowel as well as colon and also the findings on her ultrasound concerning for possible hemorrhagic cysts on the right side. Would agree with continuing with gastroenterology consult as well as gynecology consult for further evaluation.   Melvyn Neth, [place]

## 2016-11-01 NOTE — Progress Notes (Signed)
Patient ID: Shelly Middleton, female   DOB: 06/19/1975, 41 y.o.   MRN: [id number]  [facility] PROGRESS NOTE  Shelly Middleton LGX:[id number] DOB: [birth year] DOA: 10/31/2016 PCP: Wayland Denis, PA-C  HPI/Subjective: Patient with severe right lower artery and pain rated 8-9 out of 10 in intensity constant sharp pain. She's had some chills. Nausea vomiting last night but not now. No diarrhea.  Objective: Vitals:   11/01/16 0544 11/01/16 0750  BP: 101/63 109/65  Pulse: 83 81  Resp: 18 17  Temp:  98.5 F (36.9 C)  SpO2: 96% 99%    Filed Weights   10/31/16 1343  Weight: 71.2 kg (157 lb)    ROS: Review of Systems  Constitutional: Positive for chills. Negative for fever.  Eyes: Negative for blurred vision.  Respiratory: Negative for cough and shortness of breath.   Cardiovascular: Negative for chest pain.  Gastrointestinal: Positive for abdominal pain. Negative for constipation, diarrhea, nausea and vomiting.  Genitourinary: Negative for dysuria.  Musculoskeletal: Negative for joint pain.  Neurological: Negative for dizziness and headaches.   Exam: Physical Exam  Constitutional: She is oriented to person, place, and time.  HENT:  Nose: No mucosal edema.  Mouth/Throat: No oropharyngeal exudate or posterior oropharyngeal edema.  Eyes: Pupils are equal, round, and reactive to light. Conjunctivae, EOM and lids are normal.  Neck: No JVD present. Carotid bruit is not present. No edema present. No thyroid mass and no thyromegaly present.  Cardiovascular: S1 normal and S2 normal.  Exam reveals no gallop.   No murmur heard. Pulses:      Dorsalis pedis pulses are 2+ on the right side, and 2+ on the left side.  Respiratory: No respiratory distress. She has no wheezes. She has no rhonchi. She has no rales.  GI: Soft. Bowel sounds are normal. There is tenderness in the right lower quadrant.  Musculoskeletal:       Right ankle: She exhibits no swelling.       Left ankle: She exhibits no  swelling.  Lymphadenopathy:    She has no cervical adenopathy.  Neurological: She is alert and oriented to person, place, and time. No cranial nerve deficit.  Skin: Skin is warm. No rash noted. Nails show no clubbing.  Psychiatric: She has a normal mood and affect.      Data Reviewed: Basic Metabolic Panel:  Recent Labs Lab 10/31/16 1342  NA 137  K 3.4*  CL 107  CO2 24  GLUCOSE 116*  BUN 9  CREATININE 0.52  CALCIUM 8.6*   Liver Function Tests:  Recent Labs Lab 10/31/16 1342  AST 15  ALT 19  ALKPHOS 33*  BILITOT 1.3*  PROT 6.1*  ALBUMIN 4.0    Recent Labs Lab 10/31/16 1342  LIPASE 15   CBC:  Recent Labs Lab 10/31/16 1342  WBC 18.3*  HGB 13.9  HCT 39.4  MCV 90.1  PLT 270     Recent Results (from the past 240 hour(s))  Chlamydia/NGC rt PCR ([place] only)     Status: None   Collection Time: 10/31/16  5:13 PM  Result Value Ref Range Status   Specimen source GC/Chlam ENDOCERVICAL  Final   Chlamydia Tr NOT DETECTED NOT DETECTED Final   N gonorrhoeae NOT DETECTED NOT DETECTED Final    Comment: (NOTE) 100  This methodology has not been evaluated in pregnant women or in 200  patients with a history of hysterectomy. 300 400  This methodology will not be performed on patients less than 14  years  of age.   Wet prep, genital     Status: Abnormal   Collection Time: 10/31/16  5:13 PM  Result Value Ref Range Status   Yeast Wet Prep HPF POC NONE SEEN NONE SEEN Final   Trich, Wet Prep NONE SEEN NONE SEEN Final   Clue Cells Wet Prep HPF POC PRESENT (A) NONE SEEN Final   WBC, Wet Prep HPF POC MODERATE (A) NONE SEEN Final   Sperm NONE SEEN  Final     Studies: US Pelvis Transvanginal Non-ob (tv Only)  Result Date: 10/31/2016 CLINICAL DATA:  RIGHT lower quadrant pain. Assess for cyst, mass or perforation. History of inflammatory bowel disease. EXAM: TRANSABDOMINAL AND TRANSVAGINAL ULTRASOUND OF PELVIS DOPPLER ULTRASOUND OF OVARIES TECHNIQUE: Both transabdominal  and transvaginal ultrasound examinations of the pelvis were performed. Transabdominal technique was performed for global imaging of the pelvis including uterus, ovaries, adnexal regions, and pelvic cul-de-sac. It was necessary to proceed with endovaginal exam following the transabdominal exam to visualize the adnexa. Color and duplex Doppler ultrasound was utilized to evaluate blood flow to the ovaries. COMPARISON:  CT abdomen and pelvis October 31, 2016 at 1525 hours FINDINGS: Uterus Measurements: 8 x 4.5 x 6.4 cm. Hypoechoic 1.5 and 1.2 cm intramural uterine fundal leiomyomas. Endometrium Thickness: 8 mm.  No focal abnormality visualized. Right ovary Measurements: 4 x 3.4 x 3.3 cm. Two predominately echogenic masses with shading and acoustic enhancement measuring to 2.4 cm RIGHT ovary, no solid components/vascularity. Left ovary Measurements: 6.8 x 4.2 x 4.3 cm. Anechoic 3.9 x 2.5 x 3.1 cm avascular cystic mass with internal echoes, acoustic enhancement. Adjacent 2.4 x 2.0 x 1.9 cm similar sonographic appearing cystic mass. Pulsed Doppler evaluation of both ovaries demonstrates normal low-resistance arterial and venous waveforms. Other findings No abnormal free fluid. IMPRESSION: 1. Two RIGHT adnexal endometriomas, less likely hemorrhagic cysts measuring to 2.4 cm. 2. Two LEFT adnexal cysts with debris measuring to 3.9 cm, indeterminate but probably benign. 3. Two intramural leiomyomas measuring to 1.5 cm. 4. Recommend follow-up pelvic ultrasound in 6-12 weeks. This recommendation follows the consensus statement: Management of Asymptomatic Ovarian and Other Adnexal Cysts Imaged at [place]: Society of Radiologists in [place]. Radiology 2010; [phone]. Electronically Signed   By: Elon Alas M.D.   On: 10/31/2016 18:24   US Pelvis Complete  Result Date: 10/31/2016 CLINICAL DATA:  RIGHT lower quadrant pain. Assess for cyst, mass or perforation. History of inflammatory bowel  disease. EXAM: TRANSABDOMINAL AND TRANSVAGINAL ULTRASOUND OF PELVIS DOPPLER ULTRASOUND OF OVARIES TECHNIQUE: Both transabdominal and transvaginal ultrasound examinations of the pelvis were performed. Transabdominal technique was performed for global imaging of the pelvis including uterus, ovaries, adnexal regions, and pelvic cul-de-sac. It was necessary to proceed with endovaginal exam following the transabdominal exam to visualize the adnexa. Color and duplex Doppler ultrasound was utilized to evaluate blood flow to the ovaries. COMPARISON:  CT abdomen and pelvis October 31, 2016 at 1525 hours FINDINGS: Uterus Measurements: 8 x 4.5 x 6.4 cm. Hypoechoic 1.5 and 1.2 cm intramural uterine fundal leiomyomas. Endometrium Thickness: 8 mm.  No focal abnormality visualized. Right ovary Measurements: 4 x 3.4 x 3.3 cm. Two predominately echogenic masses with shading and acoustic enhancement measuring to 2.4 cm RIGHT ovary, no solid components/vascularity. Left ovary Measurements: 6.8 x 4.2 x 4.3 cm. Anechoic 3.9 x 2.5 x 3.1 cm avascular cystic mass with internal echoes, acoustic enhancement. Adjacent 2.4 x 2.0 x 1.9 cm similar sonographic appearing cystic mass. Pulsed Doppler evaluation of both  ovaries demonstrates normal low-resistance arterial and venous waveforms. Other findings No abnormal free fluid. IMPRESSION: 1. Two RIGHT adnexal endometriomas, less likely hemorrhagic cysts measuring to 2.4 cm. 2. Two LEFT adnexal cysts with debris measuring to 3.9 cm, indeterminate but probably benign. 3. Two intramural leiomyomas measuring to 1.5 cm. 4. Recommend follow-up pelvic ultrasound in 6-12 weeks. This recommendation follows the consensus statement: Management of Asymptomatic Ovarian and Other Adnexal Cysts Imaged at [place]: Society of Radiologists in [place]. Radiology 2010; [phone]. Electronically Signed   By: Elon Alas M.D.   On: 10/31/2016 18:24   Ct Abdomen Pelvis W  Contrast  Result Date: 10/31/2016 CLINICAL DATA:  Lower abdominal pain. History of ulcerative colitis. EXAM: CT ABDOMEN AND PELVIS WITH CONTRAST TECHNIQUE: Multidetector CT imaging of the abdomen and pelvis was performed using the standard protocol following bolus administration of intravenous contrast. CONTRAST:  <See Chart> ISOVUE-300 IOPAMIDOL (ISOVUE-300) INJECTION 61% COMPARISON:  None. FINDINGS: Lower chest: No acute abnormality. Hepatobiliary: No focal liver abnormality is seen. No gallstones, gallbladder wall thickening, or biliary dilatation. Pancreas: Unremarkable. No pancreatic ductal dilatation or surrounding inflammatory changes. Spleen: Normal in size without focal abnormality. Adrenals/Urinary Tract: The adrenal glands appear normal. 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 Normal appearance of the right kidney. Urinary bladder appears normal. Stomach/Bowel: The stomach appears normal. There is no abnormal dilatation of the small bowel loops. Although no wall thickening is identified involving the right lower quadrant small bowel loops there is hyperemia of the right lower quadrant small bowel loop Vasa recta suggesting underlying inflammation. There is also mild wall thickening involving the sigmoid colon. Vascular/Lymphatic: Normal appearance of the abdominal aorta. No enlarged retroperitoneal or mesenteric adenopathy. No enlarged pelvic or inguinal lymph nodes. Reproductive: The uterus contains several small enhancing fibroids. The largest is identified anteriorly measuring 1.5 cm, image 72 of series 2. Bilateral cystic lesions are noted within the adnexal regions. The largest is on the left measuring 3.5 cm. Other: There is a small amount of free fluid within the right lower quadrant of the abdomen, image 60 of series 2. Musculoskeletal: No aggressive lytic or sclerotic bone lesions. IMPRESSION: 1. Suspect mild inflammation involving the  right lower quadrant small bowel loops and sigmoid colon. Findings may be secondary to patient's known inflammatory bowel disease. Free fluid is identified within the abdomen. No evidence for bowel obstruction or bowel perforation. 2. Bilateral cystic within the adnexal regions are identified and may be ovarian in origin. Given the mild inflammatory changes within the abdomen and pelvis further evaluation with pelvic sonogram to exclude the possibility of underlying pelvic inflammatory disease and/or tubo-ovarian abscess. Electronically Signed   By: Kerby Moors M.D.   On: 10/31/2016 15:59   US Pelvic Doppler (torsion R/o Or Mass Arterial Flow)  Result Date: 10/31/2016 CLINICAL DATA:  RIGHT lower quadrant pain. Assess for cyst, mass or perforation. History of inflammatory bowel disease. EXAM: TRANSABDOMINAL AND TRANSVAGINAL ULTRASOUND OF PELVIS DOPPLER ULTRASOUND OF OVARIES TECHNIQUE: Both transabdominal and transvaginal ultrasound examinations of the pelvis were performed. Transabdominal technique was performed for global imaging of the pelvis including uterus, ovaries, adnexal regions, and pelvic cul-de-sac. It was necessary to proceed with endovaginal exam following the transabdominal exam to visualize the adnexa. Color and duplex Doppler ultrasound was utilized to evaluate blood flow to the ovaries. COMPARISON:  CT abdomen and pelvis October 31, 2016 at 1525 hours FINDINGS: Uterus Measurements: 8 x  4.5 x 6.4 cm. Hypoechoic 1.5 and 1.2 cm intramural uterine fundal leiomyomas. Endometrium Thickness: 8 mm.  No focal abnormality visualized. Right ovary Measurements: 4 x 3.4 x 3.3 cm. Two predominately echogenic masses with shading and acoustic enhancement measuring to 2.4 cm RIGHT ovary, no solid components/vascularity. Left ovary Measurements: 6.8 x 4.2 x 4.3 cm. Anechoic 3.9 x 2.5 x 3.1 cm avascular cystic mass with internal echoes, acoustic enhancement. Adjacent 2.4 x 2.0 x 1.9 cm similar sonographic  appearing cystic mass. Pulsed Doppler evaluation of both ovaries demonstrates normal low-resistance arterial and venous waveforms. Other findings No abnormal free fluid. IMPRESSION: 1. Two RIGHT adnexal endometriomas, less likely hemorrhagic cysts measuring to 2.4 cm. 2. Two LEFT adnexal cysts with debris measuring to 3.9 cm, indeterminate but probably benign. 3. Two intramural leiomyomas measuring to 1.5 cm. 4. Recommend follow-up pelvic ultrasound in 6-12 weeks. This recommendation follows the consensus statement: Management of Asymptomatic Ovarian and Other Adnexal Cysts Imaged at [place]: Society of Radiologists in [place]. Radiology 2010; [phone]. Electronically Signed   By: Elon Alas M.D.   On: 10/31/2016 18:24    Scheduled Meds: . docusate sodium  100 mg Oral BID  . mesalamine  1,000 mg Oral QID  . metroNIDAZOLE  500 mg Oral Q12H   Continuous Infusions: . sodium chloride      Assessment/Plan:  1. Right lower quadrant abdominal pain. Ultrasound showing endometrioma versus hemorrhagic cyst which is likely the cause of the patient's pain. Await gynecology consultation. Keep nothing by mouth for now. IV fluid hydration. Pain control with morphine and oxycodone. 2. Inflammation of the colon seen in the right lower quadrant on CT scan could be reactive with the endometrioma versus hemorrhagic cysts. Await GI consultation. No diarrhea. No blood seen in the stool. Less likely inflammatory bowel disease. Hold on mesalamine at this time until GI sees the patient.  No family history of inflammatory bowel disease. 3. Bacterial vaginosis includes cells on Pap smear. Flagyl ordered  Code Status:     Code Status Orders        Start     Ordered   10/31/16 2303  Full code  Continuous     10/31/16 2303    Code Status History    Date Active Date Inactive Code Status Order ID Comments User Context   This patient has a current code status but no historical  code status.     Family Communication: Permission to speak in front of daughter at the bedside Disposition Plan: Depending on gynecology and GI plans  Consultants:  Gynecology  Gastroenterology  Antibiotics:  Flagyl  Time spent: 30 minutes  [facility] [place]

## 2016-11-01 NOTE — H&P (Signed)
Shelly Middleton is an 41 y.o. female.   Chief Complaint: Abdominal pain HPI: The patient with no past medical history presents to the emergency department complaining of right lower quadrant abdominal pain. The patient states that the pain began abruptly. She took some ibuprofen without relief. The pain progressed quickly and seems to radiate to her back. She admits that she is on her menstrual cycle at this time but also states that her bleeding seems abnormal. She denies vaginal discharge or urinary frequency, urgency, hesitancy or burning. In the emergency department CT of her abdomen revealed inflamed and dilated loops of small bowel consistent with inflammatory bowel disease. Ovarian cyst as well as uterine fibroids were also seen. Continued abdominal pain and new onset of potential IBD prompted emergency department staff to call the hospitalist service for admission.  History reviewed. No pertinent past medical history. None  Past Surgical History:  Procedure Laterality Date  . none      Family History  Problem Relation Age of Onset  . Hypertension Father   . CAD Father    Social History:  reports that she has never smoked. She has never used smokeless tobacco. She reports that she does not drink alcohol. Her drug history is not on file.  Allergies:  Allergies  Allergen Reactions  . Tape Rash    No prescriptions prior to admission.    Results for orders placed or performed during the hospital encounter of 10/31/16 (from the past 48 hour(s))  Lipase, blood     Status: None   Collection Time: 10/31/16  1:42 PM  Result Value Ref Range   Lipase 15 11 - 51 U/L  Comprehensive metabolic panel     Status: Abnormal   Collection Time: 10/31/16  1:42 PM  Result Value Ref Range   Sodium 137 135 - 145 mmol/L   Potassium 3.4 (L) 3.5 - 5.1 mmol/L   Chloride 107 101 - 111 mmol/L   CO2 24 22 - 32 mmol/L   Glucose, Bld 116 (H) 65 - 99 mg/dL   BUN 9 6 - 20 mg/dL   Creatinine, Ser 0.52 0.44 -  1.00 mg/dL   Calcium 8.6 (L) 8.9 - 10.3 mg/dL   Total Protein 6.1 (L) 6.5 - 8.1 g/dL   Albumin 4.0 3.5 - 5.0 g/dL   AST 15 15 - 41 U/L   ALT 19 14 - 54 U/L   Alkaline Phosphatase 33 (L) 38 - 126 U/L   Total Bilirubin 1.3 (H) 0.3 - 1.2 mg/dL   GFR calc non Af Amer >60 >60 mL/min   GFR calc Af Amer >60 >60 mL/min    Comment: (NOTE) The eGFR has been calculated using the CKD EPI equation. This calculation has not been validated in all clinical situations. eGFR's persistently <60 mL/min signify possible Chronic Kidney Disease.    Anion gap 6 5 - 15  CBC     Status: Abnormal   Collection Time: 10/31/16  1:42 PM  Result Value Ref Range   WBC 18.3 (H) 3.6 - 11.0 K/uL   RBC 4.37 3.80 - 5.20 MIL/uL   Hemoglobin 13.9 12.0 - 16.0 g/dL   HCT 39.4 35.0 - 47.0 %   MCV 90.1 80.0 - 100.0 fL   MCH 31.7 26.0 - 34.0 pg   MCHC 35.2 32.0 - 36.0 g/dL   RDW 12.3 11.5 - 14.5 %   Platelets 270 150 - 440 K/uL  Urinalysis, Complete w Microscopic     Status: Abnormal  Collection Time: 10/31/16  1:42 PM  Result Value Ref Range   Color, Urine YELLOW (A) YELLOW   APPearance HAZY (A) CLEAR   Specific Gravity, Urine 1.018 1.005 - 1.030   pH 5.0 5.0 - 8.0   Glucose, UA NEGATIVE NEGATIVE mg/dL   Hgb urine dipstick LARGE (A) NEGATIVE   Bilirubin Urine NEGATIVE NEGATIVE   Ketones, ur 20 (A) NEGATIVE mg/dL   Protein, ur 30 (A) NEGATIVE mg/dL   Nitrite NEGATIVE NEGATIVE   Leukocytes, UA NEGATIVE NEGATIVE   RBC / HPF TOO NUMEROUS TO COUNT 0 - 5 RBC/hpf   WBC, UA 0-5 0 - 5 WBC/hpf   Bacteria, UA NONE SEEN NONE SEEN   Squamous Epithelial / LPF 0-5 (A) NONE SEEN   Mucus PRESENT   Pregnancy, urine     Status: None   Collection Time: 10/31/16  1:42 PM  Result Value Ref Range   Preg Test, Ur NEGATIVE NEGATIVE  TSH     Status: None   Collection Time: 10/31/16  1:42 PM  Result Value Ref Range   TSH 0.894 0.350 - 4.500 uIU/mL    Comment: Performed by a 3rd Generation assay with a functional sensitivity of  <=0.01 uIU/mL.  Chlamydia/NGC rt PCR ([place] only)     Status: None   Collection Time: 10/31/16  5:13 PM  Result Value Ref Range   Specimen source GC/Chlam ENDOCERVICAL    Chlamydia Tr NOT DETECTED NOT DETECTED   N gonorrhoeae NOT DETECTED NOT DETECTED    Comment: (NOTE) 100  This methodology has not been evaluated in pregnant women or in 200  patients with a history of hysterectomy. 300 400  This methodology will not be performed on patients less than 69  years of age.   Wet prep, genital     Status: Abnormal   Collection Time: 10/31/16  5:13 PM  Result Value Ref Range   Yeast Wet Prep HPF POC NONE SEEN NONE SEEN   Trich, Wet Prep NONE SEEN NONE SEEN   Clue Cells Wet Prep HPF POC PRESENT (A) NONE SEEN   WBC, Wet Prep HPF POC MODERATE (A) NONE SEEN   Sperm NONE SEEN    US Pelvis Transvanginal Non-ob (tv Only)  Result Date: 10/31/2016 CLINICAL DATA:  RIGHT lower quadrant pain. Assess for cyst, mass or perforation. History of inflammatory bowel disease. EXAM: TRANSABDOMINAL AND TRANSVAGINAL ULTRASOUND OF PELVIS DOPPLER ULTRASOUND OF OVARIES TECHNIQUE: Both transabdominal and transvaginal ultrasound examinations of the pelvis were performed. Transabdominal technique was performed for global imaging of the pelvis including uterus, ovaries, adnexal regions, and pelvic cul-de-sac. It was necessary to proceed with endovaginal exam following the transabdominal exam to visualize the adnexa. Color and duplex Doppler ultrasound was utilized to evaluate blood flow to the ovaries. COMPARISON:  CT abdomen and pelvis October 31, 2016 at 1525 hours FINDINGS: Uterus Measurements: 8 x 4.5 x 6.4 cm. Hypoechoic 1.5 and 1.2 cm intramural uterine fundal leiomyomas. Endometrium Thickness: 8 mm.  No focal abnormality visualized. Right ovary Measurements: 4 x 3.4 x 3.3 cm. Two predominately echogenic masses with shading and acoustic enhancement measuring to 2.4 cm RIGHT ovary, no solid components/vascularity. Left  ovary Measurements: 6.8 x 4.2 x 4.3 cm. Anechoic 3.9 x 2.5 x 3.1 cm avascular cystic mass with internal echoes, acoustic enhancement. Adjacent 2.4 x 2.0 x 1.9 cm similar sonographic appearing cystic mass. Pulsed Doppler evaluation of both ovaries demonstrates normal low-resistance arterial and venous waveforms. Other findings No abnormal free fluid. IMPRESSION: 1. Two  RIGHT adnexal endometriomas, less likely hemorrhagic cysts measuring to 2.4 cm. 2. Two LEFT adnexal cysts with debris measuring to 3.9 cm, indeterminate but probably benign. 3. Two intramural leiomyomas measuring to 1.5 cm. 4. Recommend follow-up pelvic ultrasound in 6-12 weeks. This recommendation follows the consensus statement: Management of Asymptomatic Ovarian and Other Adnexal Cysts Imaged at [place]: Society of Radiologists in Ultrasound Consensus Conference Statement. Radiology 2010; [phone]. Electronically Signed   By: Awilda Metro M.D.   On: 10/31/2016 18:24   US Pelvis Complete  Result Date: 10/31/2016 CLINICAL DATA:  RIGHT lower quadrant pain. Assess for cyst, mass or perforation. History of inflammatory bowel disease. EXAM: TRANSABDOMINAL AND TRANSVAGINAL ULTRASOUND OF PELVIS DOPPLER ULTRASOUND OF OVARIES TECHNIQUE: Both transabdominal and transvaginal ultrasound examinations of the pelvis were performed. Transabdominal technique was performed for global imaging of the pelvis including uterus, ovaries, adnexal regions, and pelvic cul-de-sac. It was necessary to proceed with endovaginal exam following the transabdominal exam to visualize the adnexa. Color and duplex Doppler ultrasound was utilized to evaluate blood flow to the ovaries. COMPARISON:  CT abdomen and pelvis October 31, 2016 at 1525 hours FINDINGS: Uterus Measurements: 8 x 4.5 x 6.4 cm. Hypoechoic 1.5 and 1.2 cm intramural uterine fundal leiomyomas. Endometrium Thickness: 8 mm.  No focal abnormality visualized. Right ovary Measurements: 4 x 3.4 x 3.3 cm. Two  predominately echogenic masses with shading and acoustic enhancement measuring to 2.4 cm RIGHT ovary, no solid components/vascularity. Left ovary Measurements: 6.8 x 4.2 x 4.3 cm. Anechoic 3.9 x 2.5 x 3.1 cm avascular cystic mass with internal echoes, acoustic enhancement. Adjacent 2.4 x 2.0 x 1.9 cm similar sonographic appearing cystic mass. Pulsed Doppler evaluation of both ovaries demonstrates normal low-resistance arterial and venous waveforms. Other findings No abnormal free fluid. IMPRESSION: 1. Two RIGHT adnexal endometriomas, less likely hemorrhagic cysts measuring to 2.4 cm. 2. Two LEFT adnexal cysts with debris measuring to 3.9 cm, indeterminate but probably benign. 3. Two intramural leiomyomas measuring to 1.5 cm. 4. Recommend follow-up pelvic ultrasound in 6-12 weeks. This recommendation follows the consensus statement: Management of Asymptomatic Ovarian and Other Adnexal Cysts Imaged at [place]: Society of Radiologists in Ultrasound Consensus Conference Statement. Radiology 2010; [id number]. Electronically Signed   By: Awilda Metro M.D.   On: 10/31/2016 18:24   Ct Abdomen Pelvis W Contrast  Result Date: 10/31/2016 CLINICAL DATA:  Lower abdominal pain. History of ulcerative colitis. EXAM: CT ABDOMEN AND PELVIS WITH CONTRAST TECHNIQUE: Multidetector CT imaging of the abdomen and pelvis was performed using the standard protocol following bolus administration of intravenous contrast. CONTRAST:  <See Chart> ISOVUE-300 IOPAMIDOL (ISOVUE-300) INJECTION 61% COMPARISON:  None. FINDINGS: Lower chest: No acute abnormality. Hepatobiliary: No focal liver abnormality is seen. No gallstones, gallbladder wall thickening, or biliary dilatation. Pancreas: Unremarkable. No pancreatic ductal dilatation or surrounding inflammatory changes. Spleen: Normal in size without focal abnormality. Adrenals/Urinary Tract: The adrenal glands appear normal. 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 Normal appearance of the right kidney. Urinary bladder appears normal. Stomach/Bowel: The stomach appears normal. There is no abnormal dilatation of the small bowel loops. Although no wall thickening is identified involving the right lower quadrant small bowel loops there is hyperemia of the right lower quadrant small bowel loop Vasa recta suggesting underlying inflammation. There is also mild wall thickening involving the sigmoid colon. Vascular/Lymphatic: Normal appearance of the abdominal aorta. No enlarged retroperitoneal or mesenteric adenopathy. No enlarged pelvic or inguinal  lymph nodes. Reproductive: The uterus contains several small enhancing fibroids. The largest is identified anteriorly measuring 1.5 cm, image 72 of series 2. Bilateral cystic lesions are noted within the adnexal regions. The largest is on the left measuring 3.5 cm. Other: There is a small amount of free fluid within the right lower quadrant of the abdomen, image 60 of series 2. Musculoskeletal: No aggressive lytic or sclerotic bone lesions. IMPRESSION: 1. Suspect mild inflammation involving the right lower quadrant small bowel loops and sigmoid colon. Findings may be secondary to patient's known inflammatory bowel disease. Free fluid is identified within the abdomen. No evidence for bowel obstruction or bowel perforation. 2. Bilateral cystic within the adnexal regions are identified and may be ovarian in origin. Given the mild inflammatory changes within the abdomen and pelvis further evaluation with pelvic sonogram to exclude the possibility of underlying pelvic inflammatory disease and/or tubo-ovarian abscess. Electronically Signed   By: Kerby Moors M.D.   On: 10/31/2016 15:59   US Pelvic Doppler (torsion R/o Or Mass Arterial Flow)  Result Date: 10/31/2016 CLINICAL DATA:  RIGHT lower quadrant pain. Assess for cyst, mass or perforation. History of inflammatory bowel disease. EXAM: TRANSABDOMINAL AND  TRANSVAGINAL ULTRASOUND OF PELVIS DOPPLER ULTRASOUND OF OVARIES TECHNIQUE: Both transabdominal and transvaginal ultrasound examinations of the pelvis were performed. Transabdominal technique was performed for global imaging of the pelvis including uterus, ovaries, adnexal regions, and pelvic cul-de-sac. It was necessary to proceed with endovaginal exam following the transabdominal exam to visualize the adnexa. Color and duplex Doppler ultrasound was utilized to evaluate blood flow to the ovaries. COMPARISON:  CT abdomen and pelvis October 31, 2016 at 1525 hours FINDINGS: Uterus Measurements: 8 x 4.5 x 6.4 cm. Hypoechoic 1.5 and 1.2 cm intramural uterine fundal leiomyomas. Endometrium Thickness: 8 mm.  No focal abnormality visualized. Right ovary Measurements: 4 x 3.4 x 3.3 cm. Two predominately echogenic masses with shading and acoustic enhancement measuring to 2.4 cm RIGHT ovary, no solid components/vascularity. Left ovary Measurements: 6.8 x 4.2 x 4.3 cm. Anechoic 3.9 x 2.5 x 3.1 cm avascular cystic mass with internal echoes, acoustic enhancement. Adjacent 2.4 x 2.0 x 1.9 cm similar sonographic appearing cystic mass. Pulsed Doppler evaluation of both ovaries demonstrates normal low-resistance arterial and venous waveforms. Other findings No abnormal free fluid. IMPRESSION: 1. Two RIGHT adnexal endometriomas, less likely hemorrhagic cysts measuring to 2.4 cm. 2. Two LEFT adnexal cysts with debris measuring to 3.9 cm, indeterminate but probably benign. 3. Two intramural leiomyomas measuring to 1.5 cm. 4. Recommend follow-up pelvic ultrasound in 6-12 weeks. This recommendation follows the consensus statement: Management of Asymptomatic Ovarian and Other Adnexal Cysts Imaged at [place]: Society of Radiologists in [place]. Radiology 2010; [phone]. Electronically Signed   By: Elon Alas M.D.   On: 10/31/2016 18:24    Review of Systems  Constitutional: Negative for chills and  fever.  HENT: Negative for sore throat and tinnitus.   Eyes: Negative for blurred vision and redness.  Respiratory: Negative for cough and shortness of breath.   Cardiovascular: Negative for chest pain, palpitations, orthopnea and PND.  Gastrointestinal: Positive for abdominal pain. Negative for blood in stool, constipation, diarrhea, melena, nausea and vomiting.  Genitourinary: Negative for dysuria, frequency and urgency.  Musculoskeletal: Negative for joint pain and myalgias.  Skin: Negative for rash.       No lesions  Neurological: Negative for speech change, focal weakness and weakness.  Endo/Heme/Allergies: Does not bruise/bleed easily.  No temperature intolerance  Psychiatric/Behavioral: Negative for depression and suicidal ideas.    Blood pressure 111/70, pulse 91, temperature 98.3 F (36.8 C), temperature source Oral, resp. rate 18, height 5' 4" (1.626 m), weight 71.2 kg (157 lb), last menstrual period 10/31/2016, SpO2 97 %. Physical Exam  Vitals reviewed. Constitutional: She is oriented to person, place, and time. She appears well-developed and well-nourished. No distress.  HENT:  Head: Normocephalic and atraumatic.  Mouth/Throat: Oropharynx is clear and moist.  Eyes: Pupils are equal, round, and reactive to light. Conjunctivae and EOM are normal. No scleral icterus.  Neck: Normal range of motion. Neck supple. No JVD present. No tracheal deviation present. No thyromegaly present.  Cardiovascular: Normal rate, regular rhythm and normal heart sounds.  Exam reveals no gallop and no friction rub.   No murmur heard. Respiratory: Effort normal and breath sounds normal.  GI: Soft. Bowel sounds are normal. She exhibits mass. She exhibits no distension. There is tenderness. There is no rebound and no guarding.  Genitourinary:  Genitourinary Comments: Deferred  Musculoskeletal: Normal range of motion. She exhibits no edema.  Lymphadenopathy:    She has no cervical adenopathy.   Neurological: She is alert and oriented to person, place, and time. No cranial nerve deficit. She exhibits normal muscle tone.  Skin: Skin is warm and dry. No rash noted. No erythema.  Psychiatric: She has a normal mood and affect. Her behavior is normal. Judgment and thought content normal.     Assessment/Plan This is a 40 year old female admitted for inflammatory bowel disease. 1. IBD: New onset; likely Crohn's. I started the patient on mesalamine. Consult gastroenterology. 2. Leukocytosis: Secondary to above and or gynecological inflammation.  3. Bacterial vaginosis: I started the patient on Flagyl 4. Abnormal uterine bleeding: Likely secondary to fibroids and/or ovarian cysts. Consult gynecology. 5. DVT prophylaxis: Early ambulation 6. GI prophylaxis: None The patient is a full code. Time spent on admission orders and patient care approximately 45 minutes  Harrie Foreman, MD 11/01/2016, 3:54 AM

## 2016-11-01 NOTE — Consult Note (Signed)
Lucilla Lame, MD [facility]  [address] [place], [place] [address] Phone: [phone] Fax : [phone]  Consultation  Referring Provider:     Dr. Earleen Newport Primary Care Physician:  Wayland Denis, PA-C Primary Gastroenterologist:  Althia Forts         Reason for Consultation:     Abdominal pain  Date of Admission:  10/31/2016 Date of Consultation:  11/01/2016         HPI:   Shelly Middleton is a 41 y.o. female Who comes in after experiencing severe abdominal pain.  The patient states that her abdominal pain was located in multiple spots throughout her abdomen.  The patient had a CT scan of the abdomen which was concerning for possible inflammatory bowel disease.  There is no family history of inflammatory bowel disease.  The patient also denies having any diarrhea or rectal bleeding.  She states that her abdominal pain is still present but better today than it was previously.  The patient was seen by surgery who did not recommend any intervention.  The patient also had a possible hemorrhagic ovarian cyst.  Although she has pain diffusely she states that the pain is worse on the right side in the lower abdomen.  There is been no report of any recent unexplained weight loss fevers chills nausea or vomiting.  She states when the pain occurred she had just finished a meeting with her boss and the pain was so intense she cannot even move. On admission to the hospital her white cell count was 18.3 with a normal hemoglobin and hematocrit. This morning her white cell count is down to 13.1.  History reviewed. No pertinent past medical history.  Past Surgical History:  Procedure Laterality Date  . BUNIONECTOMY Bilateral 2002 (left), 2009 (right)    Prior to Admission medications   Medication Sig Start Date End Date Taking? Authorizing Provider  HYDROcodone-acetaminophen (NORCO) 5-325 MG tablet Take 1 tablet by mouth every 4 (four) hours as needed for moderate pain. 10/31/16   Merlyn Lot, MD    metroNIDAZOLE (FLAGYL) 500 MG tablet Take 1 tablet (500 mg total) by mouth 2 (two) times daily. 10/31/16 11/07/16  Merlyn Lot, MD  promethazine (PHENERGAN) 12.5 MG tablet Take 1 tablet (12.5 mg total) by mouth every 6 (six) hours as needed for nausea or vomiting. 10/31/16   Merlyn Lot, MD    Family History  Problem Relation Age of Onset  . Hypertension Father   . CAD Father   . Breast cancer Maternal Grandmother 78       and possibly 1 or 2 of grandmother's sisters     Social History  Substance Use Topics  . Smoking status: Never Smoker  . Smokeless tobacco: Never Used  . Alcohol use No    Allergies as of 10/31/2016 - Review Complete 10/31/2016  Allergen Reaction Noted  . Tape Rash 10/31/2016    Review of Systems:    All systems reviewed and negative except where noted in HPI.   Physical Exam:  Vital signs in last 24 hours: Temp:  [98.3 F (36.8 C)-101.2 F (38.4 C)] 99.2 F (37.3 C) (09/06 1517) Pulse Rate:  [81-93] 93 (09/06 1238) Resp:  [17-18] 17 (09/06 0750) BP: (101-113)/(63-72) 112/72 (09/06 1238) SpO2:  [96 %-99 %] 97 % (09/06 1238) Last BM Date: 10/31/16 General:   Pleasant, cooperative in NAD Head:  Normocephalic and atraumatic. Eyes:   No icterus.   Conjunctiva pink. PERRLA. Ears:  Normal auditory acuity. Neck:  Supple; no masses or thyroidomegaly Lungs: Respirations even and unlabored. Lungs clear to auscultation bilaterally.   No wheezes, crackles, or rhonchi.  Heart:  Regular rate and rhythm;  Without murmur, clicks, rubs or gallops Abdomen:  Soft, nondistended, Mild tenderness in the right lower quadrant. Normal bowel sounds. No appreciable masses or hepatomegaly.  No rebound or guarding.  Rectal:  Not performed. Msk:  Symmetrical without gross deformities.    Extremities:  Without edema, cyanosis or clubbing. Neurologic:  Alert and oriented x3;  grossly normal neurologically. Skin:  Intact without significant lesions or rashes. Cervical  Nodes:  No significant cervical adenopathy. Psych:  Alert and cooperative. Normal affect.  LAB RESULTS:  Recent Labs  10/31/16 1342 11/01/16 1234  WBC 18.3* 13.1*  HGB 13.9 12.8  HCT 39.4 36.0  PLT 270 234   BMET  Recent Labs  10/31/16 1342  NA 137  K 3.4*  CL 107  CO2 24  GLUCOSE 116*  BUN 9  CREATININE 0.52  CALCIUM 8.6*   LFT  Recent Labs  10/31/16 1342  PROT 6.1*  ALBUMIN 4.0  AST 15  ALT 19  ALKPHOS 33*  BILITOT 1.3*   PT/INR No results for input(s): LABPROT, INR in the last 72 hours.  STUDIES: US Pelvis Transvanginal Non-ob (tv Only)  Result Date: 10/31/2016 CLINICAL DATA:  RIGHT lower quadrant pain. Assess for cyst, mass or perforation. History of inflammatory bowel disease. EXAM: TRANSABDOMINAL AND TRANSVAGINAL ULTRASOUND OF PELVIS DOPPLER ULTRASOUND OF OVARIES TECHNIQUE: Both transabdominal and transvaginal ultrasound examinations of the pelvis were performed. Transabdominal technique was performed for global imaging of the pelvis including uterus, ovaries, adnexal regions, and pelvic cul-de-sac. It was necessary to proceed with endovaginal exam following the transabdominal exam to visualize the adnexa. Color and duplex Doppler ultrasound was utilized to evaluate blood flow to the ovaries. COMPARISON:  CT abdomen and pelvis October 31, 2016 at 1525 hours FINDINGS: Uterus Measurements: 8 x 4.5 x 6.4 cm. Hypoechoic 1.5 and 1.2 cm intramural uterine fundal leiomyomas. Endometrium Thickness: 8 mm.  No focal abnormality visualized. Right ovary Measurements: 4 x 3.4 x 3.3 cm. Two predominately echogenic masses with shading and acoustic enhancement measuring to 2.4 cm RIGHT ovary, no solid components/vascularity. Left ovary Measurements: 6.8 x 4.2 x 4.3 cm. Anechoic 3.9 x 2.5 x 3.1 cm avascular cystic mass with internal echoes, acoustic enhancement. Adjacent 2.4 x 2.0 x 1.9 cm similar sonographic appearing cystic mass. Pulsed Doppler evaluation of both ovaries  demonstrates normal low-resistance arterial and venous waveforms. Other findings No abnormal free fluid. IMPRESSION: 1. Two RIGHT adnexal endometriomas, less likely hemorrhagic cysts measuring to 2.4 cm. 2. Two LEFT adnexal cysts with debris measuring to 3.9 cm, indeterminate but probably benign. 3. Two intramural leiomyomas measuring to 1.5 cm. 4. Recommend follow-up pelvic ultrasound in 6-12 weeks. This recommendation follows the consensus statement: Management of Asymptomatic Ovarian and Other Adnexal Cysts Imaged at [place]: Society of Radiologists in [place]. Radiology 2010; [phone]. Electronically Signed   By: Elon Alas M.D.   On: 10/31/2016 18:24   US Pelvis Complete  Result Date: 10/31/2016 CLINICAL DATA:  RIGHT lower quadrant pain. Assess for cyst, mass or perforation. History of inflammatory bowel disease. EXAM: TRANSABDOMINAL AND TRANSVAGINAL ULTRASOUND OF PELVIS DOPPLER ULTRASOUND OF OVARIES TECHNIQUE: Both transabdominal and transvaginal ultrasound examinations of the pelvis were performed. Transabdominal technique was performed for global imaging of the pelvis including uterus, ovaries, adnexal regions, and pelvic cul-de-sac. It was necessary to proceed with endovaginal  exam following the transabdominal exam to visualize the adnexa. Color and duplex Doppler ultrasound was utilized to evaluate blood flow to the ovaries. COMPARISON:  CT abdomen and pelvis October 31, 2016 at 1525 hours FINDINGS: Uterus Measurements: 8 x 4.5 x 6.4 cm. Hypoechoic 1.5 and 1.2 cm intramural uterine fundal leiomyomas. Endometrium Thickness: 8 mm.  No focal abnormality visualized. Right ovary Measurements: 4 x 3.4 x 3.3 cm. Two predominately echogenic masses with shading and acoustic enhancement measuring to 2.4 cm RIGHT ovary, no solid components/vascularity. Left ovary Measurements: 6.8 x 4.2 x 4.3 cm. Anechoic 3.9 x 2.5 x 3.1 cm avascular cystic mass with internal echoes,  acoustic enhancement. Adjacent 2.4 x 2.0 x 1.9 cm similar sonographic appearing cystic mass. Pulsed Doppler evaluation of both ovaries demonstrates normal low-resistance arterial and venous waveforms. Other findings No abnormal free fluid. IMPRESSION: 1. Two RIGHT adnexal endometriomas, less likely hemorrhagic cysts measuring to 2.4 cm. 2. Two LEFT adnexal cysts with debris measuring to 3.9 cm, indeterminate but probably benign. 3. Two intramural leiomyomas measuring to 1.5 cm. 4. Recommend follow-up pelvic ultrasound in 6-12 weeks. This recommendation follows the consensus statement: Management of Asymptomatic Ovarian and Other Adnexal Cysts Imaged at [place]: Society of Radiologists in [place]. Radiology 2010; [phone]. Electronically Signed   By: Elon Alas M.D.   On: 10/31/2016 18:24   Ct Abdomen Pelvis W Contrast  Result Date: 10/31/2016 CLINICAL DATA:  Lower abdominal pain. History of ulcerative colitis. EXAM: CT ABDOMEN AND PELVIS WITH CONTRAST TECHNIQUE: Multidetector CT imaging of the abdomen and pelvis was performed using the standard protocol following bolus administration of intravenous contrast. CONTRAST:  <See Chart> ISOVUE-300 IOPAMIDOL (ISOVUE-300) INJECTION 61% COMPARISON:  None. FINDINGS: Lower chest: No acute abnormality. Hepatobiliary: No focal liver abnormality is seen. No gallstones, gallbladder wall thickening, or biliary dilatation. Pancreas: Unremarkable. No pancreatic ductal dilatation or surrounding inflammatory changes. Spleen: Normal in size without focal abnormality. Adrenals/Urinary Tract: The adrenal glands appear normal. 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 Normal appearance of the right kidney. Urinary bladder appears normal. Stomach/Bowel: The stomach appears normal. There is no abnormal dilatation of the small bowel loops. Although no wall thickening is identified involving the  right lower quadrant small bowel loops there is hyperemia of the right lower quadrant small bowel loop Vasa recta suggesting underlying inflammation. There is also mild wall thickening involving the sigmoid colon. Vascular/Lymphatic: Normal appearance of the abdominal aorta. No enlarged retroperitoneal or mesenteric adenopathy. No enlarged pelvic or inguinal lymph nodes. Reproductive: The uterus contains several small enhancing fibroids. The largest is identified anteriorly measuring 1.5 cm, image 72 of series 2. Bilateral cystic lesions are noted within the adnexal regions. The largest is on the left measuring 3.5 cm. Other: There is a small amount of free fluid within the right lower quadrant of the abdomen, image 60 of series 2. Musculoskeletal: No aggressive lytic or sclerotic bone lesions. IMPRESSION: 1. Suspect mild inflammation involving the right lower quadrant small bowel loops and sigmoid colon. Findings may be secondary to patient's known inflammatory bowel disease. Free fluid is identified within the abdomen. No evidence for bowel obstruction or bowel perforation. 2. Bilateral cystic within the adnexal regions are identified and may be ovarian in origin. Given the mild inflammatory changes within the abdomen and pelvis further evaluation with pelvic sonogram to exclude the possibility of underlying pelvic inflammatory disease and/or tubo-ovarian abscess. Electronically Signed   By:  Kerby Moors M.D.   On: 10/31/2016 15:59   US Pelvic Doppler (torsion R/o Or Mass Arterial Flow)  Result Date: 10/31/2016 CLINICAL DATA:  RIGHT lower quadrant pain. Assess for cyst, mass or perforation. History of inflammatory bowel disease. EXAM: TRANSABDOMINAL AND TRANSVAGINAL ULTRASOUND OF PELVIS DOPPLER ULTRASOUND OF OVARIES TECHNIQUE: Both transabdominal and transvaginal ultrasound examinations of the pelvis were performed. Transabdominal technique was performed for global imaging of the pelvis including uterus,  ovaries, adnexal regions, and pelvic cul-de-sac. It was necessary to proceed with endovaginal exam following the transabdominal exam to visualize the adnexa. Color and duplex Doppler ultrasound was utilized to evaluate blood flow to the ovaries. COMPARISON:  CT abdomen and pelvis October 31, 2016 at 1525 hours FINDINGS: Uterus Measurements: 8 x 4.5 x 6.4 cm. Hypoechoic 1.5 and 1.2 cm intramural uterine fundal leiomyomas. Endometrium Thickness: 8 mm.  No focal abnormality visualized. Right ovary Measurements: 4 x 3.4 x 3.3 cm. Two predominately echogenic masses with shading and acoustic enhancement measuring to 2.4 cm RIGHT ovary, no solid components/vascularity. Left ovary Measurements: 6.8 x 4.2 x 4.3 cm. Anechoic 3.9 x 2.5 x 3.1 cm avascular cystic mass with internal echoes, acoustic enhancement. Adjacent 2.4 x 2.0 x 1.9 cm similar sonographic appearing cystic mass. Pulsed Doppler evaluation of both ovaries demonstrates normal low-resistance arterial and venous waveforms. Other findings No abnormal free fluid. IMPRESSION: 1. Two RIGHT adnexal endometriomas, less likely hemorrhagic cysts measuring to 2.4 cm. 2. Two LEFT adnexal cysts with debris measuring to 3.9 cm, indeterminate but probably benign. 3. Two intramural leiomyomas measuring to 1.5 cm. 4. Recommend follow-up pelvic ultrasound in 6-12 weeks. This recommendation follows the consensus statement: Management of Asymptomatic Ovarian and Other Adnexal Cysts Imaged at [place]: Society of Radiologists in [place]. Radiology 2010; [phone]. Electronically Signed   By: Elon Alas M.D.   On: 10/31/2016 18:24      Impression / Plan:   Shelly Middleton is a 41 y.o. y/o female with abdominal pain that is worse on the right.  The patient is a CT scan suggestive of possible inflammatory bowel disease.  The patient does not have any rectal bleeding diarrhea or family history of inflammatory bowel disease.  The patient will be set  up for a colonoscopy for tomorrow.  The patient will be given a prep tonight. I have discussed risks & benefits which include, but are not limited to, bleeding, infection, perforation & drug reaction.  The patient agrees with this plan & written consent will be obtained.      Thank you for involving me in the care of this patient.      LOS: 0 days   Lucilla Lame, MD  11/01/2016, 8:14 PM   Note: This dictation was prepared with Dragon dictation along with smaller phrase technology. Any transcriptional errors that result from this process are unintentional.

## 2016-11-02 ENCOUNTER — Observation Stay: Payer: BC Managed Care – PPO | Admitting: Anesthesiology

## 2016-11-02 ENCOUNTER — Encounter: Payer: Self-pay | Admitting: Anesthesiology

## 2016-11-02 ENCOUNTER — Encounter
Admission: EM | Disposition: A | Discharge status: Home / Self Care | Payer: Self-pay | Source: Home / Self Care | Attending: Student in an Organized Health Care Education/Training Program

## 2016-11-02 DIAGNOSIS — R933 Abnormal findings on diagnostic imaging of other parts of digestive tract: Secondary | ICD-10-CM | POA: Diagnosis not present

## 2016-11-02 HISTORY — PX: COLONOSCOPY WITH PROPOFOL: SHX5780

## 2016-11-02 LAB — BASIC METABOLIC PANEL
Anion gap: 3 — ABNORMAL LOW (ref 5–15)
BUN: 5 mg/dL — AB (ref 6–20)
CHLORIDE: 110 mmol/L (ref 101–111)
CO2: 27 mmol/L (ref 22–32)
CREATININE: 0.49 mg/dL (ref 0.44–1.00)
Calcium: 7.8 mg/dL — ABNORMAL LOW (ref 8.9–10.3)
GFR calc Af Amer: >60 mL/min (ref >60)
GFR calc non Af Amer: >60 mL/min (ref >60)
Glucose, Bld: 99 mg/dL (ref 65–99)
Potassium: 3.3 mmol/L — ABNORMAL LOW (ref 3.5–5.1)
SODIUM: 140 mmol/L (ref 135–145)

## 2016-11-02 LAB — MAGNESIUM: Magnesium: 2 mg/dL (ref 1.7–2.4)

## 2016-11-02 LAB — PREGNANCY, URINE: Preg Test, Ur: NEGATIVE

## 2016-11-02 SURGERY — COLONOSCOPY WITH PROPOFOL
Anesthesia: General

## 2016-11-02 MED ORDER — POTASSIUM CHLORIDE IN NACL 20-0.9 MEQ/L-% IV SOLN
INTRAVENOUS | Status: DC
  Administered 2016-11-02 – 2016-11-04 (×4): via INTRAVENOUS
  Filled 2016-11-02 (×6): qty 1000

## 2016-11-02 MED ORDER — METRONIDAZOLE 500 MG PO TABS
500.0000 mg | ORAL_TABLET | Freq: Two times a day (BID) | ORAL | Status: DC
  Administered 2016-11-02 – 2016-11-03 (×3): 500 mg via ORAL
  Filled 2016-11-02 (×4): qty 1

## 2016-11-02 MED ORDER — PROPOFOL 500 MG/50ML IV EMUL
INTRAVENOUS | Status: AC
  Filled 2016-11-02: qty 50

## 2016-11-02 MED ORDER — MIDAZOLAM HCL 2 MG/2ML IJ SOLN
INTRAMUSCULAR | Status: AC
  Filled 2016-11-02: qty 2

## 2016-11-02 MED ORDER — LIDOCAINE HCL (PF) 2 % IJ SOLN
INTRAMUSCULAR | Status: AC
  Filled 2016-11-02: qty 2

## 2016-11-02 MED ORDER — PROPOFOL 500 MG/50ML IV EMUL
INTRAVENOUS | Status: DC | PRN
  Administered 2016-11-02: 150 ug/kg/min via INTRAVENOUS

## 2016-11-02 MED ORDER — MIDAZOLAM HCL 2 MG/2ML IJ SOLN
INTRAMUSCULAR | Status: DC | PRN
  Administered 2016-11-02: 1 mg via INTRAVENOUS

## 2016-11-02 MED ORDER — PROPOFOL 10 MG/ML IV BOLUS
INTRAVENOUS | Status: DC | PRN
Start: 2016-11-02 — End: 2016-11-02
  Administered 2016-11-02: 70 mg via INTRAVENOUS

## 2016-11-02 NOTE — Op Note (Signed)
[facility] Gastroenterology Patient Name: Shelly Middleton Procedure Date: 11/02/2016 1:05 PM MRN: [id number] Account #: [id number] Date of Birth: 23-May-1975 Admit Type: Inpatient Age: 41 Room: [facility] ENDO ROOM 4 Gender: Female Note Status: Finalized Procedure:            Colonoscopy Indications:          Abnormal CT of the GI tract Providers:            Lucilla Lame MD, MD Referring MD:         No Local Md, MD (Referring MD) Medicines:            Propofol per Anesthesia Complications:        No immediate complications. Procedure:            Pre-Anesthesia Assessment:                       - Prior to the procedure, a History and Physical was                        performed, and patient medications and allergies were                        reviewed. The patient's tolerance of previous                        anesthesia was also reviewed. The risks and benefits of                        the procedure and the sedation options and risks were                        discussed with the patient. All questions were                        answered, and informed consent was obtained. Prior                        Anticoagulants: The patient has taken no previous                        anticoagulant or antiplatelet agents. ASA Grade                        Assessment: II - A patient with mild systemic disease.                        After reviewing the risks and benefits, the patient was                        deemed in satisfactory condition to undergo the                        procedure.                       After obtaining informed consent, the colonoscope was                        passed under direct vision. Throughout the procedure,  the patient's blood pressure, pulse, and oxygen                        saturations were monitored continuously. The                        Colonoscope was introduced through the anus and                        advanced to the  the terminal ileum. The colonoscopy was                        performed without difficulty. The patient tolerated the                        procedure well. The quality of the bowel preparation                        was excellent. Findings:      The perianal and digital rectal examinations were normal.      The terminal ileum appeared normal.      The colon (entire examined portion) appeared normal. Random biopsies       were obtained with cold forceps for histology randomly in the entire       colon. Impression:           - The examined portion of the ileum was normal.                       - The entire examined colon is normal.                       - Random biopsies were obtained in the entire colon. Recommendation:       - Return patient to hospital ward for ongoing care.                       - Resume previous diet.                       - Continue present medications. Procedure Code(s):    --- Professional ---                       [id number], Colonoscopy, flexible; with biopsy, single or                        multiple Diagnosis Code(s):    --- Professional ---                       R93.3, Abnormal findings on diagnostic imaging of other                        parts of digestive tract CPT copyright 2016 American Medical Association. All rights reserved. The codes documented in this report are preliminary and upon coder review may  be revised to meet current compliance requirements. Lucilla Lame MD, MD 11/02/2016 1:38:35 PM This report has been signed electronically. Number of Addenda: 0 Note Initiated On: 11/02/2016 1:05 PM Scope Withdrawal Time: 0 hours 8 minutes 58 seconds  Total Procedure Duration: 0 hours 13 minutes 2 seconds       [place]  [facility]

## 2016-11-02 NOTE — Anesthesia Postprocedure Evaluation (Signed)
Anesthesia Post Note  Patient: Shelly Middleton  Procedure(s) Performed: Procedure(s) (LRB): COLONOSCOPY WITH PROPOFOL (N/A)  Patient location during evaluation: Endoscopy Anesthesia Type: General Level of consciousness: awake and alert Pain management: pain level controlled Vital Signs Assessment: post-procedure vital signs reviewed and stable Respiratory status: spontaneous breathing and respiratory function stable Cardiovascular status: stable Anesthetic complications: no     Last Vitals:  Vitals:   11/02/16 1409 11/02/16 1420  BP: 123/84 133/75  Pulse:  92  Resp:  20  Temp:  (!) 36.3 C  SpO2:  100%    Last Pain:  Vitals:   11/02/16 1339  TempSrc: Tympanic  PainSc:                  Jayston Trevino K

## 2016-11-02 NOTE — Anesthesia Preprocedure Evaluation (Signed)
Anesthesia Evaluation  Patient identified by MRN, date of birth, ID band Patient awake    Reviewed: Allergy & Precautions, H&P , NPO status , Patient's Chart, lab work & pertinent test results  History of Anesthesia Complications Negative for: history of anesthetic complications  Airway Mallampati: III  TM Distance: <3 FB Neck ROM: full    Dental  (+) Poor Dentition   Pulmonary neg pulmonary ROS, neg shortness of breath,           Cardiovascular Exercise Tolerance: Good (-) angina(-) Past MI negative cardio ROS       Neuro/Psych negative neurological ROS  negative psych ROS   GI/Hepatic negative GI ROS, Neg liver ROS, neg GERD  ,  Endo/Other  negative endocrine ROS  Renal/GU negative Renal ROS  negative genitourinary   Musculoskeletal   Abdominal   Peds  Hematology negative hematology ROS (+)   Anesthesia Other Findings History reviewed. No pertinent past medical history.  Past Surgical History: 2002 (left), 2009 (right): BUNIONECTOMY; Bilateral  BMI    Body Mass Index:  26.78 kg/m      Reproductive/Obstetrics negative OB ROS                             Anesthesia Physical Anesthesia Plan  ASA: I  Anesthesia Plan: General   Post-op Pain Management:    Induction: Intravenous  PONV Risk Score and Plan: Propofol infusion  Airway Management Planned: Natural Airway and Nasal Cannula  Additional Equipment:   Intra-op Plan:   Post-operative Plan:   Informed Consent: I have reviewed the patients History and Physical, chart, labs and discussed the procedure including the risks, benefits and alternatives for the proposed anesthesia with the patient or authorized representative who has indicated his/her understanding and acceptance.   Dental Advisory Given  Plan Discussed with: Anesthesiologist, CRNA and Surgeon  Anesthesia Plan Comments: (Patient consented for risks of  anesthesia including but not limited to:  - adverse reactions to medications - risk of intubation if required - damage to teeth, lips or other oral mucosa - sore throat or hoarseness - Damage to heart, brain, lungs or loss of life  Patient voiced understanding.)        Anesthesia Quick Evaluation

## 2016-11-02 NOTE — Progress Notes (Signed)
Patient ID: Shelly Middleton, female   DOB: Dec 20, 1975, 41 y.o.   MRN: [id number]      Consult Note  Requesting Attending Physician :  Loletha Grayer, MD  Assessment/Recommendations: 1.  No obvious source of her pain. I doubt that her very small endometriomas or even smaller uterine fibroids are the source. 2.  From a GYN standpoint it is possible that she has endometriosis and this is the first occurrence of significant pain. This would be an atypical presentation for endometriosis as this typically occurs and becomes worse over time. It is not usually sudden onset.  PLAN: I have discussed with her the possibility of menstrual regulation using OCPs for IUD. We have also discussed the possibility of expectant management without any medication to see if this does recur with her next cycle. We have discussed endometriosis in great detail including the treatment and workup. I made it clear to her that the workup as an outpatient workup and could possibly result in laparoscopy to make the diagnosis. Dr. Marcelline Mates or I will be happy to see her as an outpatient once discharged. ([facility])  Active Problems:   Abdominal pain   Abnormal abdominal CT scan   Abnormal CT scan, colon  HOPI:   Shelly Middleton is a 41 y.o. female seen in consultation at the request of Wieting, Richard, MD regarding right-sided pelvic pain possibly associated with her menstrual cycle. Her family history is significant for her mother having endometriosis and a hysterectomy for this condition. The patient has had a colonoscopy, ultrasound, and CAT scan. She still rates her pain very high today but we spoke for 15 minutes and she did not seem to have any pain at all during our conversation. She denies nausea and vomiting and has had a regular diet this evening. She reports that her menstrual period has now stopped. She states that this is the first occurrence of pelvic pain with her period that she has  ev        Obstetric History:  OB History  Gravida Para Term Preterm AB Living  4 4       4   SAB TAB Ectopic Multiple Live Births               # Outcome Date GA Lbr Len/2nd Weight Sex Delivery Anes PTL Lv  4 Para           3 Para           2 Para           1 Para                Social History: Social History   Social History  . Marital status: Married    Spouse name: N/A  . Number of children: N/A  . Years of education: N/A   Social History Main Topics  . Smoking status: Never Smoker  . Smokeless tobacco: Never Used  . Alcohol use No  . Drug use: No  . Sexual activity: Yes    Birth control/ protection: Other-see comments     Comment: Vasectomy   Other Topics Concern  . None   Social History Narrative  . None    Family History: family history includes Breast cancer (age of onset: 22) in her maternal grandmother; CAD in her father; Hypertension in her father.  Endometriosis in her mother. (Previous hysterectomy)   Objective :  Vital signs in last 24 hours: Temp:  [97 F (36.1 C)-98.9 F (37.2 C)]  97.4 F (36.3 C) (09/07 1420) Pulse Rate:  [74-92] 92 (09/07 1420) Resp:  [16-21] 20 (09/07 1420) BP: (87-133)/(48-84) 133/75 (09/07 1420) SpO2:  [95 %-100 %] 100 % (09/07 1420) Weight:  [156 lb (70.8 kg)] 156 lb (70.8 kg) (09/07 0420)  Intake/Output last 3 shifts: I/O last 3 completed shifts: In: 1651.5 [P.O.:240; I.V.:1411.5] Out: 901 [Urine:900; Stool:1]   Physical Exam: Abdomen is soft nontender no guarding no rebound.   Finis Bud, M.D. 11/02/2016 5:49 PM

## 2016-11-02 NOTE — Progress Notes (Signed)
Bowel prep for colonoscopy was started on 11/01/2016 at 2015. Patient did not want to start earlier because she had visitors and wanted to wait - explained that the sooner she starts the better, so she has more time to drink prep, but still she wanted to wait. Patient had intermittent nausea while drinking prep and was not able to finish bottle. Approximately 1/2 to 2/3 was completed. Patient stopped drinking at midnight. Patient has not yet had a bowel movement - will continue to monitor.

## 2016-11-02 NOTE — Progress Notes (Signed)
Patient ID: Shelly Middleton, female   DOB: June 18, 1975, 41 y.o.   MRN: [id number]   [facility] PROGRESS NOTE  Shelly Middleton [id number] DOB: [birth year] DOA: 10/31/2016 PCP: Wayland Denis, PA-C  HPI/Subjective: Patient seen in the morning and again after colonoscopy this afternoon. Patient in 12 out of 10 pain. Had fever yesterday.  Pain is in the right lower quadrant.  No diarrhea.  Objective: Vitals:   11/02/16 1409 11/02/16 1420  BP: 123/84 133/75  Pulse:  92  Resp:  20  Temp:  (!) 97.4 F (36.3 C)  SpO2:  100%    Filed Weights   10/31/16 1343 11/02/16 0420  Weight: 71.2 kg (157 lb) 70.8 kg (156 lb)    ROS: Review of Systems  Constitutional: Positive for chills and fever.  Eyes: Negative for blurred vision.  Respiratory: Negative for cough and shortness of breath.   Cardiovascular: Negative for chest pain.  Gastrointestinal: Positive for abdominal pain. Negative for constipation, diarrhea, nausea and vomiting.  Genitourinary: Negative for dysuria.  Musculoskeletal: Negative for joint pain.  Neurological: Negative for dizziness and headaches.   Exam: Physical Exam  Constitutional: She is oriented to person, place, and time.  HENT:  Nose: No mucosal edema.  Mouth/Throat: No oropharyngeal exudate or posterior oropharyngeal edema.  Eyes: Pupils are equal, round, and reactive to light. Conjunctivae, EOM and lids are normal.  Neck: No JVD present. Carotid bruit is not present. No edema present. No thyroid mass and no thyromegaly present.  Cardiovascular: S1 normal and S2 normal.  Exam reveals no gallop.   No murmur heard. Pulses:      Dorsalis pedis pulses are 2+ on the right side, and 2+ on the left side.  Respiratory: No respiratory distress. She has no wheezes. She has no rhonchi. She has no rales.  GI: Soft. Bowel sounds are normal. There is tenderness in the right lower quadrant.  Musculoskeletal:       Right ankle: She exhibits no swelling.       Left ankle:  She exhibits no swelling.  Lymphadenopathy:    She has no cervical adenopathy.  Neurological: She is alert and oriented to person, place, and time. No cranial nerve deficit.  Skin: Skin is warm. No rash noted. Nails show no clubbing.  Psychiatric: She has a normal mood and affect.      Data Reviewed: Basic Metabolic Panel:  Recent Labs Lab 10/31/16 1342 11/02/16 0716  NA 137 140  K 3.4* 3.3*  CL 107 110  CO2 24 27  GLUCOSE 116* 99  BUN 9 5*  CREATININE 0.52 0.49  CALCIUM 8.6* 7.8*  MG  --  2.0   Liver Function Tests:  Recent Labs Lab 10/31/16 1342  AST 15  ALT 19  ALKPHOS 33*  BILITOT 1.3*  PROT 6.1*  ALBUMIN 4.0    Recent Labs Lab 10/31/16 1342  LIPASE 15   CBC:  Recent Labs Lab 10/31/16 1342 11/01/16 1234  WBC 18.3* 13.1*  NEUTROABS  --  10.6*  HGB 13.9 12.8  HCT 39.4 36.0  MCV 90.1 92.3  PLT 270 234     Recent Results (from the past 240 hour(s))  Chlamydia/NGC rt PCR ([facility] only)     Status: None   Collection Time: 10/31/16  5:13 PM  Result Value Ref Range Status   Specimen source GC/Chlam ENDOCERVICAL  Final   Chlamydia Tr NOT DETECTED NOT DETECTED Final   N gonorrhoeae NOT DETECTED NOT DETECTED Final    Comment: (NOTE)  100  This methodology has not been evaluated in pregnant women or in 200  patients with a history of hysterectomy. 300 400  This methodology will not be performed on patients less than 100  years of age.   Wet prep, genital     Status: Abnormal   Collection Time: 10/31/16  5:13 PM  Result Value Ref Range Status   Yeast Wet Prep HPF POC NONE SEEN NONE SEEN Final   Trich, Wet Prep NONE SEEN NONE SEEN Final   Clue Cells Wet Prep HPF POC PRESENT (A) NONE SEEN Final   WBC, Wet Prep HPF POC MODERATE (A) NONE SEEN Final   Sperm NONE SEEN  Final     Studies: US Pelvis Transvanginal Non-ob (tv Only)  Result Date: 10/31/2016 CLINICAL DATA:  RIGHT lower quadrant pain. Assess for cyst, mass or perforation. History of  inflammatory bowel disease. EXAM: TRANSABDOMINAL AND TRANSVAGINAL ULTRASOUND OF PELVIS DOPPLER ULTRASOUND OF OVARIES TECHNIQUE: Both transabdominal and transvaginal ultrasound examinations of the pelvis were performed. Transabdominal technique was performed for global imaging of the pelvis including uterus, ovaries, adnexal regions, and pelvic cul-de-sac. It was necessary to proceed with endovaginal exam following the transabdominal exam to visualize the adnexa. Color and duplex Doppler ultrasound was utilized to evaluate blood flow to the ovaries. COMPARISON:  CT abdomen and pelvis October 31, 2016 at 1525 hours FINDINGS: Uterus Measurements: 8 x 4.5 x 6.4 cm. Hypoechoic 1.5 and 1.2 cm intramural uterine fundal leiomyomas. Endometrium Thickness: 8 mm.  No focal abnormality visualized. Right ovary Measurements: 4 x 3.4 x 3.3 cm. Two predominately echogenic masses with shading and acoustic enhancement measuring to 2.4 cm RIGHT ovary, no solid components/vascularity. Left ovary Measurements: 6.8 x 4.2 x 4.3 cm. Anechoic 3.9 x 2.5 x 3.1 cm avascular cystic mass with internal echoes, acoustic enhancement. Adjacent 2.4 x 2.0 x 1.9 cm similar sonographic appearing cystic mass. Pulsed Doppler evaluation of both ovaries demonstrates normal low-resistance arterial and venous waveforms. Other findings No abnormal free fluid. IMPRESSION: 1. Two RIGHT adnexal endometriomas, less likely hemorrhagic cysts measuring to 2.4 cm. 2. Two LEFT adnexal cysts with debris measuring to 3.9 cm, indeterminate but probably benign. 3. Two intramural leiomyomas measuring to 1.5 cm. 4. Recommend follow-up pelvic ultrasound in 6-12 weeks. This recommendation follows the consensus statement: Management of Asymptomatic Ovarian and Other Adnexal Cysts Imaged at [place]: Society of Radiologists in [place]. Radiology 2010; [phone]. Electronically Signed   By: Elon Alas M.D.   On: 10/31/2016 18:24   US Pelvis  Complete  Result Date: 10/31/2016 CLINICAL DATA:  RIGHT lower quadrant pain. Assess for cyst, mass or perforation. History of inflammatory bowel disease. EXAM: TRANSABDOMINAL AND TRANSVAGINAL ULTRASOUND OF PELVIS DOPPLER ULTRASOUND OF OVARIES TECHNIQUE: Both transabdominal and transvaginal ultrasound examinations of the pelvis were performed. Transabdominal technique was performed for global imaging of the pelvis including uterus, ovaries, adnexal regions, and pelvic cul-de-sac. It was necessary to proceed with endovaginal exam following the transabdominal exam to visualize the adnexa. Color and duplex Doppler ultrasound was utilized to evaluate blood flow to the ovaries. COMPARISON:  CT abdomen and pelvis October 31, 2016 at 1525 hours FINDINGS: Uterus Measurements: 8 x 4.5 x 6.4 cm. Hypoechoic 1.5 and 1.2 cm intramural uterine fundal leiomyomas. Endometrium Thickness: 8 mm.  No focal abnormality visualized. Right ovary Measurements: 4 x 3.4 x 3.3 cm. Two predominately echogenic masses with shading and acoustic enhancement measuring to 2.4 cm RIGHT ovary, no solid components/vascularity. Left ovary Measurements: 6.8  x 4.2 x 4.3 cm. Anechoic 3.9 x 2.5 x 3.1 cm avascular cystic mass with internal echoes, acoustic enhancement. Adjacent 2.4 x 2.0 x 1.9 cm similar sonographic appearing cystic mass. Pulsed Doppler evaluation of both ovaries demonstrates normal low-resistance arterial and venous waveforms. Other findings No abnormal free fluid. IMPRESSION: 1. Two RIGHT adnexal endometriomas, less likely hemorrhagic cysts measuring to 2.4 cm. 2. Two LEFT adnexal cysts with debris measuring to 3.9 cm, indeterminate but probably benign. 3. Two intramural leiomyomas measuring to 1.5 cm. 4. Recommend follow-up pelvic ultrasound in 6-12 weeks. This recommendation follows the consensus statement: Management of Asymptomatic Ovarian and Other Adnexal Cysts Imaged at [place]: Society of Radiologists in [place]. Radiology 2010; [phone]. Electronically Signed   By: Elon Alas M.D.   On: 10/31/2016 18:24   US Pelvic Doppler (torsion R/o Or Mass Arterial Flow)  Result Date: 10/31/2016 CLINICAL DATA:  RIGHT lower quadrant pain. Assess for cyst, mass or perforation. History of inflammatory bowel disease. EXAM: TRANSABDOMINAL AND TRANSVAGINAL ULTRASOUND OF PELVIS DOPPLER ULTRASOUND OF OVARIES TECHNIQUE: Both transabdominal and transvaginal ultrasound examinations of the pelvis were performed. Transabdominal technique was performed for global imaging of the pelvis including uterus, ovaries, adnexal regions, and pelvic cul-de-sac. It was necessary to proceed with endovaginal exam following the transabdominal exam to visualize the adnexa. Color and duplex Doppler ultrasound was utilized to evaluate blood flow to the ovaries. COMPARISON:  CT abdomen and pelvis October 31, 2016 at 1525 hours FINDINGS: Uterus Measurements: 8 x 4.5 x 6.4 cm. Hypoechoic 1.5 and 1.2 cm intramural uterine fundal leiomyomas. Endometrium Thickness: 8 mm.  No focal abnormality visualized. Right ovary Measurements: 4 x 3.4 x 3.3 cm. Two predominately echogenic masses with shading and acoustic enhancement measuring to 2.4 cm RIGHT ovary, no solid components/vascularity. Left ovary Measurements: 6.8 x 4.2 x 4.3 cm. Anechoic 3.9 x 2.5 x 3.1 cm avascular cystic mass with internal echoes, acoustic enhancement. Adjacent 2.4 x 2.0 x 1.9 cm similar sonographic appearing cystic mass. Pulsed Doppler evaluation of both ovaries demonstrates normal low-resistance arterial and venous waveforms. Other findings No abnormal free fluid. IMPRESSION: 1. Two RIGHT adnexal endometriomas, less likely hemorrhagic cysts measuring to 2.4 cm. 2. Two LEFT adnexal cysts with debris measuring to 3.9 cm, indeterminate but probably benign. 3. Two intramural leiomyomas measuring to 1.5 cm. 4. Recommend follow-up pelvic ultrasound in 6-12 weeks. This recommendation  follows the consensus statement: Management of Asymptomatic Ovarian and Other Adnexal Cysts Imaged at [place]: Society of Radiologists in [place]. Radiology 2010; [phone]. Electronically Signed   By: Elon Alas M.D.   On: 10/31/2016 18:24    Scheduled Meds: . docusate sodium  100 mg Oral BID  . metroNIDAZOLE  500 mg Oral Q12H   Continuous Infusions: . 0.9 % NaCl with KCl 20 mEq / L 75 mL/hr at 11/02/16 0900    Assessment/Plan:  1. Right lower quadrant abdominal pain. Ultrasound showing endometrioma versus hemorrhagic cyst which is likely the cause of the patient's pain.  Case discussed with Dr. Marcelline Mates gynecology that saw the patient yesterday. She stated she will have Dr. Amalia Hailey her associated evaluate the patient. IV fluid hydration. Pain control with morphine and oxycodone.  I will let the patient eat today and nothing by mouth after midnight just in case diagnostic laparoscopic evaluation needed.  Case discussed with general surgery this is not her appendix causing the issue. If a diagnostic laparoscopic evaluation needed they are available to join and on the  surgery. 2. Colonoscopy negative.  Inflammation seen on CT scan of the colon was an over read by the radiologist. 3. Bacterial vaginosis includes cells on Pap smear. Flagyl ordered 4. Hypokalemia. Potassium replaced and IV fluids  Code Status:     Code Status Orders        Start     Ordered   10/31/16 2303  Full code  Continuous     10/31/16 2303    Code Status History    Date Active Date Inactive Code Status Order ID Comments User Context   This patient has a current code status but no historical code status.     Family Communication: Permission to speak in front of mother at the bedside Disposition Plan: pain needs to be less prior to disposition  Consultants:  Gynecology  Gastroenterology  Antibiotics:  Flagyl  Time spent: 35 minutes  [place], [place]

## 2016-11-02 NOTE — Transfer of Care (Signed)
Immediate Anesthesia Transfer of Care Note  Patient: Shelly Middleton  Procedure(s) Performed: Procedure(s): COLONOSCOPY WITH PROPOFOL (N/A)  Patient Location: PACU  Anesthesia Type:General  Level of Consciousness: awake, alert  and oriented  Airway & Oxygen Therapy: Patient Spontanous Breathing and Patient connected to nasal cannula oxygen  Post-op Assessment: Report given to RN and Post -op Vital signs reviewed and stable  Post vital signs: Reviewed and stable  Last Vitals:  Vitals:   11/02/16 1223 11/02/16 1339  BP: 110/60 (!) 88/51  Pulse: 86 74  Resp: 16 (!) 21  Temp: 36.7 C (!) 36.1 C  SpO2: 97% 99%    Last Pain:  Vitals:   11/02/16 1339  TempSrc: Tympanic  PainSc:          Complications: No apparent anesthesia complications

## 2016-11-02 NOTE — Anesthesia Post-op Follow-up Note (Signed)
Anesthesia QCDR form completed.        

## 2016-11-03 LAB — BASIC METABOLIC PANEL
ANION GAP: 3 — AB (ref 5–15)
BUN: <5 mg/dL — ABNORMAL LOW (ref 6–20)
CHLORIDE: 109 mmol/L (ref 101–111)
CO2: 27 mmol/L (ref 22–32)
CREATININE: 0.63 mg/dL (ref 0.44–1.00)
Calcium: 7.9 mg/dL — ABNORMAL LOW (ref 8.9–10.3)
GFR calc non Af Amer: >60 mL/min (ref >60)
Glucose, Bld: 104 mg/dL — ABNORMAL HIGH (ref 65–99)
POTASSIUM: 3.5 mmol/L (ref 3.5–5.1)
SODIUM: 139 mmol/L (ref 135–145)

## 2016-11-03 LAB — CBC
HCT: 32.3 % — ABNORMAL LOW (ref 35.0–47.0)
HEMOGLOBIN: 11.7 g/dL — AB (ref 12.0–16.0)
MCH: 33.2 pg (ref 26.0–34.0)
MCHC: 36.1 g/dL — AB (ref 32.0–36.0)
MCV: 92.1 fL (ref 80.0–100.0)
Platelets: 243 10*3/uL (ref 150–440)
RBC: 3.51 MIL/uL — AB (ref 3.80–5.20)
RDW: 12.4 % (ref 11.5–14.5)
WBC: 9.7 10*3/uL (ref 3.6–11.0)

## 2016-11-03 NOTE — Progress Notes (Signed)
Pt stating that her appetite has decreased. She denies nausea and vomiting. She is passing flatus and had a bowel movement this AM. Dr. Posey Pronto notified via text page system. No new orders received.

## 2016-11-03 NOTE — Progress Notes (Signed)
Pt asking if she can shower this AM. Dr. Posey Pronto notified, and he stated that she may shower. Order placed in EPIC.

## 2016-11-03 NOTE — Progress Notes (Signed)
Patient ID: Shelly Middleton, female   DOB: [birth year], 41 y.o.   MRN: [id number]   [facility] PROGRESS NOTE  Shelly Middleton KXF:[id number] DOB: 02-10-1976 DOA: 10/31/2016 PCP: Wayland Denis, PA-C  HPI/Subjective: Continues to complain of abdominal pain. States that it hurts when she walks  Objective: Vitals:   11/02/16 2126 11/03/16 0547  BP: 124/85 121/62  Pulse: (!) 105 100  Resp: 18 17  Temp: 98.7 F (37.1 C) 99.1 F (37.3 C)  SpO2: 98% 96%    Filed Weights   10/31/16 1343 11/02/16 0420 11/03/16 0500  Weight: 157 lb (71.2 kg) 156 lb (70.8 kg) 159 lb 12.8 oz (72.5 kg)    ROS: Review of Systems  Constitutional: Positive for chills and fever.  Eyes: Negative for blurred vision.  Respiratory: Negative for cough and shortness of breath.   Cardiovascular: Negative for chest pain.  Gastrointestinal: Positive for abdominal pain. Negative for constipation, diarrhea, nausea and vomiting.  Genitourinary: Negative for dysuria.  Musculoskeletal: Negative for joint pain.  Neurological: Negative for dizziness and headaches.   Exam: Physical Exam  Constitutional: She is oriented to person, place, and time.  HENT:  Nose: No mucosal edema.  Mouth/Throat: No oropharyngeal exudate or posterior oropharyngeal edema.  Eyes: Pupils are equal, round, and reactive to light. Conjunctivae, EOM and lids are normal.  Neck: No JVD present. Carotid bruit is not present. No edema present. No thyroid mass and no thyromegaly present.  Cardiovascular: S1 normal and S2 normal.  Exam reveals no gallop.   No murmur heard. Pulses:      Dorsalis pedis pulses are 2+ on the right side, and 2+ on the left side.  Respiratory: No respiratory distress. She has no wheezes. She has no rhonchi. She has no rales.  GI: Soft. Bowel sounds are normal. There is tenderness in the right lower quadrant.  Musculoskeletal:       Right ankle: She exhibits no swelling.       Left ankle: She exhibits no swelling.   Lymphadenopathy:    She has no cervical adenopathy.  Neurological: She is alert and oriented to person, place, and time. No cranial nerve deficit.  Skin: Skin is warm. No rash noted. Nails show no clubbing.  Psychiatric: She has a normal mood and affect.      Data Reviewed: Basic Metabolic Panel:  Recent Labs Lab 10/31/16 1342 11/02/16 0716 11/03/16 0329  NA 137 140 139  K 3.4* 3.3* 3.5  CL 107 110 109  CO2 24 27 27   GLUCOSE 116* 99 104*  BUN 9 5* <5*  CREATININE 0.52 0.49 0.63  CALCIUM 8.6* 7.8* 7.9*  MG  --  2.0  --    Liver Function Tests:  Recent Labs Lab 10/31/16 1342  AST 15  ALT 19  ALKPHOS 33*  BILITOT 1.3*  PROT 6.1*  ALBUMIN 4.0    Recent Labs Lab 10/31/16 1342  LIPASE 15   CBC:  Recent Labs Lab 10/31/16 1342 11/01/16 1234 11/03/16 0329  WBC 18.3* 13.1* 9.7  NEUTROABS  --  10.6*  --   HGB 13.9 12.8 11.7*  HCT 39.4 36.0 32.3*  MCV 90.1 92.3 92.1  PLT 270 234 243     Recent Results (from the past 240 hour(s))  Chlamydia/NGC rt PCR ([facility] only)     Status: None   Collection Time: 10/31/16  5:13 PM  Result Value Ref Range Status   Specimen source GC/Chlam ENDOCERVICAL  Final   Chlamydia Tr NOT DETECTED NOT DETECTED  Final   N gonorrhoeae NOT DETECTED NOT DETECTED Final    Comment: (NOTE) 100  This methodology has not been evaluated in pregnant women or in 200  patients with a history of hysterectomy. 300 400  This methodology will not be performed on patients less than 60  years of age.   Wet prep, genital     Status: Abnormal   Collection Time: 10/31/16  5:13 PM  Result Value Ref Range Status   Yeast Wet Prep HPF POC NONE SEEN NONE SEEN Final   Trich, Wet Prep NONE SEEN NONE SEEN Final   Clue Cells Wet Prep HPF POC PRESENT (A) NONE SEEN Final   WBC, Wet Prep HPF POC MODERATE (A) NONE SEEN Final   Sperm NONE SEEN  Final     Studies: No results found.  Scheduled Meds: . docusate sodium  100 mg Oral BID  . metroNIDAZOLE   500 mg Oral Q12H   Continuous Infusions: . 0.9 % NaCl with KCl 20 mEq / L 75 mL/hr at 11/03/16 0031    Assessment/Plan:  1. Right lower quadrant abdominal pain. Ultrasound showing endometrioma versus hemorrhagic cyst which is likely the cause of the patient's pain.  Seen by gastroenterology and GYN patient's pain out of proportion to typical endometriosis however continues to be symptomatic we'll continue current therapy 2. Colonoscopy negative.  Inflammation seen on CT scan of the colon was an over read by the radiologist. 3. Bacterial vaginosis includes cells on Pap smear.continue Flagyl 4. Hypokalemia. Potassium replaced and IV fluids  Code Status:     Code Status Orders        Start     Ordered   10/31/16 2303  Full code  Continuous     10/31/16 2303    Code Status History    Date Active Date Inactive Code Status Order ID Comments User Context   This patient has a current code status but no historical code status.     Family Communication: Permission to speak in front of mother at the bedside Disposition Plan: pain needs to be less prior to disposition  Consultants:  Gynecology  Gastroenterology  Antibiotics:  Flagyl  Time spent: 32 minutes  [place], [facility]

## 2016-11-04 LAB — CBC
HEMATOCRIT: 31.1 % — AB (ref 35.0–47.0)
HEMOGLOBIN: 11.3 g/dL — AB (ref 12.0–16.0)
MCH: 33.3 pg (ref 26.0–34.0)
MCHC: 36.5 g/dL — AB (ref 32.0–36.0)
MCV: 91.2 fL (ref 80.0–100.0)
Platelets: 264 10*3/uL (ref 150–440)
RBC: 3.41 MIL/uL — AB (ref 3.80–5.20)
RDW: 12 % (ref 11.5–14.5)
WBC: 8.5 10*3/uL (ref 3.6–11.0)

## 2016-11-04 LAB — BASIC METABOLIC PANEL
Anion gap: 4 — ABNORMAL LOW (ref 5–15)
CHLORIDE: 110 mmol/L (ref 101–111)
CO2: 25 mmol/L (ref 22–32)
Calcium: 8.1 mg/dL — ABNORMAL LOW (ref 8.9–10.3)
Creatinine, Ser: 0.58 mg/dL (ref 0.44–1.00)
GFR calc Af Amer: >60 mL/min (ref >60)
GFR calc non Af Amer: >60 mL/min (ref >60)
Glucose, Bld: 102 mg/dL — ABNORMAL HIGH (ref 65–99)
POTASSIUM: 3.7 mmol/L (ref 3.5–5.1)
SODIUM: 139 mmol/L (ref 135–145)

## 2016-11-04 MED ORDER — METRONIDAZOLE 500 MG PO TABS: 500.0000 mg | ORAL_TABLET | Freq: Two times a day (BID) | ORAL | 0 refills | Status: AC

## 2016-11-04 MED ORDER — HYDROCODONE-ACETAMINOPHEN 5-325 MG PO TABS: 1.0000 | ORAL_TABLET | ORAL | 0 refills | Status: DC | PRN

## 2016-11-04 NOTE — Progress Notes (Signed)
IV removed from patient. Discharge instructions given to patient along with hard copy prescriptions. No distress at this time. Husband is at bedside and will be transporting patient home.

## 2016-11-04 NOTE — Progress Notes (Signed)
[place] at [place] was admitted to the [facility] on 10/31/2016 and Discharged  11/04/2016 and should be excused from work/school   for 6 days starting 10/31/2016 , may return to work/school without any restrictions.  Call Dustin Flock MD with questions.  Dustin Flock M.D on 11/04/2016,at 9:39 AM  [place] at [facility]  [phone]

## 2016-11-04 NOTE — Discharge Instructions (Signed)
[facility] [place] at [facility] ° °DIET:  °Regular diet ° °DISCHARGE CONDITION:  °Stable ° °ACTIVITY:  °Activity as tolerated ° °OXYGEN:  °Home Oxygen: No. °  °Oxygen Delivery: room air ° °DISCHARGE LOCATION:  °home  ° ° °ADDITIONAL DISCHARGE INSTRUCTION: ° ° °If you experience worsening of your admission symptoms, develop shortness of breath, life threatening emergency, suicidal or homicidal thoughts you must seek medical attention immediately by calling 911 or calling your MD immediately  if symptoms less severe. ° °You Must read complete instructions/literature along with all the possible adverse reactions/side effects for all the Medicines you take and that have been prescribed to you. Take any new Medicines after you have completely understood and accpet all the possible adverse reactions/side effects.  ° °Please note ° °You were cared for by a hospitalist during your hospital stay. If you have any questions about your discharge medications or the care you received while you were in the hospital after you are discharged, you can call the unit and asked to speak with the hospitalist on call if the hospitalist that took care of you is not available. Once you are discharged, your primary care physician will handle any further medical issues. Please note that NO REFILLS for any discharge medications will be authorized once you are discharged, as it is imperative that you return to your primary care physician (or establish a relationship with a primary care physician if you do not have one) for your aftercare needs so that they can reassess your need for medications and monitor your lab values. ° ° °

## 2016-11-04 NOTE — Progress Notes (Signed)
Pt. Requested pain medication x1, pt. Medicated, she still stated her pain was the same upon re-assessment but did not request anymore pain medication. Pt. Slept well throughout the night.

## 2016-11-04 NOTE — Discharge Summary (Signed)
[place] at [facility], [place] y.o., DOB [birth year], MRN [id number]. Admission date: 10/31/2016 Discharge Date 11/04/2016 Primary MD Wayland Denis, PA-C Admitting Physician Harrie Foreman, MD  Admission Diagnosis  Abdominal pain, RLQ [R10.31] Cyst of ovary, unspecified laterality [N83.209] Leukocytosis, unspecified type [D72.829]  Discharge Diagnosis   Active Problems:   Right lower quadrantAbdominal pain due to possible endometrosis   Bacterial vaginosis   hypokalemia       Hospital Course  The patient with no past medical history presents to the emergency department complaining of right lower quadrant abdominal pain. The patient states that the pain began abruptly. She took some ibuprofen without relief. The pain progressed quickly and seems to radiate to her back. She admits that she is on her menstrual cycle at this time but also states that her bleeding seems abnormal. She denies vaginal discharge or urinary frequency, urgency, hesitancy or burning. In the emergency department CT of her abdomen revealed inflamed and dilated loops of small bowel consistent with inflammatory bowel disease. Ovarian cyst as well as uterine fibroids were also seen.patient was admitted for further evaluation and therapy. She was seen by GI and underwent colonoscopy which was negative. Therefore a GYN consult was obtained for possible endometriosis. They felt that this was atypical for endometriosis. Patient's pain was controlled. She did have some white blood cells in his Pap smear therefore was treated with Flagyl. Patient's abdominal pain is now much improved.            Consults  GI, gyn  Significant Tests:  See full reports for all details     US Pelvis Transvanginal Non-ob (tv Only)  Result Date: 10/31/2016 CLINICAL DATA:  RIGHT lower quadrant pain. Assess for cyst, mass or perforation. History of inflammatory bowel disease. EXAM: TRANSABDOMINAL AND  TRANSVAGINAL ULTRASOUND OF PELVIS DOPPLER ULTRASOUND OF OVARIES TECHNIQUE: Both transabdominal and transvaginal ultrasound examinations of the pelvis were performed. Transabdominal technique was performed for global imaging of the pelvis including uterus, ovaries, adnexal regions, and pelvic cul-de-sac. It was necessary to proceed with endovaginal exam following the transabdominal exam to visualize the adnexa. Color and duplex Doppler ultrasound was utilized to evaluate blood flow to the ovaries. COMPARISON:  CT abdomen and pelvis October 31, 2016 at 1525 hours FINDINGS: Uterus Measurements: 8 x 4.5 x 6.4 cm. Hypoechoic 1.5 and 1.2 cm intramural uterine fundal leiomyomas. Endometrium Thickness: 8 mm.  No focal abnormality visualized. Right ovary Measurements: 4 x 3.4 x 3.3 cm. Two predominately echogenic masses with shading and acoustic enhancement measuring to 2.4 cm RIGHT ovary, no solid components/vascularity. Left ovary Measurements: 6.8 x 4.2 x 4.3 cm. Anechoic 3.9 x 2.5 x 3.1 cm avascular cystic mass with internal echoes, acoustic enhancement. Adjacent 2.4 x 2.0 x 1.9 cm similar sonographic appearing cystic mass. Pulsed Doppler evaluation of both ovaries demonstrates normal low-resistance arterial and venous waveforms. Other findings No abnormal free fluid. IMPRESSION: 1. Two RIGHT adnexal endometriomas, less likely hemorrhagic cysts measuring to 2.4 cm. 2. Two LEFT adnexal cysts with debris measuring to 3.9 cm, indeterminate but probably benign. 3. Two intramural leiomyomas measuring to 1.5 cm. 4. Recommend follow-up pelvic ultrasound in 6-12 weeks. This recommendation follows the consensus statement: Management of Asymptomatic Ovarian and Other Adnexal Cysts Imaged at [place]: Society of Radiologists in [place]. Radiology 2010; [phone]. Electronically Signed   By: Elon Alas M.D.   On: 10/31/2016 18:24   US Pelvis Complete  Result Date: 10/31/2016 CLINICAL  DATA:   RIGHT lower quadrant pain. Assess for cyst, mass or perforation. History of inflammatory bowel disease. EXAM: TRANSABDOMINAL AND TRANSVAGINAL ULTRASOUND OF PELVIS DOPPLER ULTRASOUND OF OVARIES TECHNIQUE: Both transabdominal and transvaginal ultrasound examinations of the pelvis were performed. Transabdominal technique was performed for global imaging of the pelvis including uterus, ovaries, adnexal regions, and pelvic cul-de-sac. It was necessary to proceed with endovaginal exam following the transabdominal exam to visualize the adnexa. Color and duplex Doppler ultrasound was utilized to evaluate blood flow to the ovaries. COMPARISON:  CT abdomen and pelvis October 31, 2016 at 1525 hours FINDINGS: Uterus Measurements: 8 x 4.5 x 6.4 cm. Hypoechoic 1.5 and 1.2 cm intramural uterine fundal leiomyomas. Endometrium Thickness: 8 mm.  No focal abnormality visualized. Right ovary Measurements: 4 x 3.4 x 3.3 cm. Two predominately echogenic masses with shading and acoustic enhancement measuring to 2.4 cm RIGHT ovary, no solid components/vascularity. Left ovary Measurements: 6.8 x 4.2 x 4.3 cm. Anechoic 3.9 x 2.5 x 3.1 cm avascular cystic mass with internal echoes, acoustic enhancement. Adjacent 2.4 x 2.0 x 1.9 cm similar sonographic appearing cystic mass. Pulsed Doppler evaluation of both ovaries demonstrates normal low-resistance arterial and venous waveforms. Other findings No abnormal free fluid. IMPRESSION: 1. Two RIGHT adnexal endometriomas, less likely hemorrhagic cysts measuring to 2.4 cm. 2. Two LEFT adnexal cysts with debris measuring to 3.9 cm, indeterminate but probably benign. 3. Two intramural leiomyomas measuring to 1.5 cm. 4. Recommend follow-up pelvic ultrasound in 6-12 weeks. This recommendation follows the consensus statement: Management of Asymptomatic Ovarian and Other Adnexal Cysts Imaged at [place]: Society of Radiologists in [place]. Radiology 2010; [phone].  Electronically Signed   By: Elon Alas M.D.   On: 10/31/2016 18:24   Ct Abdomen Pelvis W Contrast  Result Date: 10/31/2016 CLINICAL DATA:  Lower abdominal pain. History of ulcerative colitis. EXAM: CT ABDOMEN AND PELVIS WITH CONTRAST TECHNIQUE: Multidetector CT imaging of the abdomen and pelvis was performed using the standard protocol following bolus administration of intravenous contrast. CONTRAST:  <See Chart> ISOVUE-300 IOPAMIDOL (ISOVUE-300) INJECTION 61% COMPARISON:  None. FINDINGS: Lower chest: No acute abnormality. Hepatobiliary: No focal liver abnormality is seen. No gallstones, gallbladder wall thickening, or biliary dilatation. Pancreas: Unremarkable. No pancreatic ductal dilatation or surrounding inflammatory changes. Spleen: Normal in size without focal abnormality. Adrenals/Urinary Tract: The adrenal glands appear normal. 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 Normal appearance of the right kidney. Urinary bladder appears normal. Stomach/Bowel: The stomach appears normal. There is no abnormal dilatation of the small bowel loops. Although no wall thickening is identified involving the right lower quadrant small bowel loops there is hyperemia of the right lower quadrant small bowel loop Vasa recta suggesting underlying inflammation. There is also mild wall thickening involving the sigmoid colon. Vascular/Lymphatic: Normal appearance of the abdominal aorta. No enlarged retroperitoneal or mesenteric adenopathy. No enlarged pelvic or inguinal lymph nodes. Reproductive: The uterus contains several small enhancing fibroids. The largest is identified anteriorly measuring 1.5 cm, image 72 of series 2. Bilateral cystic lesions are noted within the adnexal regions. The largest is on the left measuring 3.5 cm. Other: There is a small amount of free fluid within the right lower quadrant of the abdomen, image 60 of series 2. Musculoskeletal: No  aggressive lytic or sclerotic bone lesions. IMPRESSION: 1. Suspect mild inflammation involving the right lower quadrant small bowel loops and sigmoid colon. Findings may be secondary to patient's  known inflammatory bowel disease. Free fluid is identified within the abdomen. No evidence for bowel obstruction or bowel perforation. 2. Bilateral cystic within the adnexal regions are identified and may be ovarian in origin. Given the mild inflammatory changes within the abdomen and pelvis further evaluation with pelvic sonogram to exclude the possibility of underlying pelvic inflammatory disease and/or tubo-ovarian abscess. Electronically Signed   By: Kerby Moors M.D.   On: 10/31/2016 15:59   US Pelvic Doppler (torsion R/o Or Mass Arterial Flow)  Result Date: 10/31/2016 CLINICAL DATA:  RIGHT lower quadrant pain. Assess for cyst, mass or perforation. History of inflammatory bowel disease. EXAM: TRANSABDOMINAL AND TRANSVAGINAL ULTRASOUND OF PELVIS DOPPLER ULTRASOUND OF OVARIES TECHNIQUE: Both transabdominal and transvaginal ultrasound examinations of the pelvis were performed. Transabdominal technique was performed for global imaging of the pelvis including uterus, ovaries, adnexal regions, and pelvic cul-de-sac. It was necessary to proceed with endovaginal exam following the transabdominal exam to visualize the adnexa. Color and duplex Doppler ultrasound was utilized to evaluate blood flow to the ovaries. COMPARISON:  CT abdomen and pelvis October 31, 2016 at 1525 hours FINDINGS: Uterus Measurements: 8 x 4.5 x 6.4 cm. Hypoechoic 1.5 and 1.2 cm intramural uterine fundal leiomyomas. Endometrium Thickness: 8 mm.  No focal abnormality visualized. Right ovary Measurements: 4 x 3.4 x 3.3 cm. Two predominately echogenic masses with shading and acoustic enhancement measuring to 2.4 cm RIGHT ovary, no solid components/vascularity. Left ovary Measurements: 6.8 x 4.2 x 4.3 cm. Anechoic 3.9 x 2.5 x 3.1 cm avascular cystic mass  with internal echoes, acoustic enhancement. Adjacent 2.4 x 2.0 x 1.9 cm similar sonographic appearing cystic mass. Pulsed Doppler evaluation of both ovaries demonstrates normal low-resistance arterial and venous waveforms. Other findings No abnormal free fluid. IMPRESSION: 1. Two RIGHT adnexal endometriomas, less likely hemorrhagic cysts measuring to 2.4 cm. 2. Two LEFT adnexal cysts with debris measuring to 3.9 cm, indeterminate but probably benign. 3. Two intramural leiomyomas measuring to 1.5 cm. 4. Recommend follow-up pelvic ultrasound in 6-12 weeks. This recommendation follows the consensus statement: Management of Asymptomatic Ovarian and Other Adnexal Cysts Imaged at [place]: Society of Radiologists in [place]. Radiology 2010; [phone]. Electronically Signed   By: Elon Alas M.D.   On: 10/31/2016 18:24       Today   Subjective:   Shelly Middleton feels better  Objective:   Blood pressure 119/68, pulse 71, temperature 98 F (36.7 C), temperature source Oral, resp. rate 16, height 5\' 4"  (1.626 m), weight 156 lb 7 oz (71 kg), last menstrual period 10/31/2016, SpO2 99 %.  .  Intake/Output Summary (Last 24 hours) at 11/04/16 1418 Last data filed at 11/04/16 0859  Gross per 24 hour  Intake          1813.75 ml  Output             1850 ml  Net           -36.25 ml    Exam VITAL SIGNS: Blood pressure 119/68, pulse 71, temperature 98 F (36.7 C), temperature source Oral, resp. rate 16, height 5\' 4"  (1.626 m), weight 156 lb 7 oz (71 kg), last menstrual period 10/31/2016, SpO2 99 %.  GENERAL:  41 y.o.-year-old patient lying in the bed with no acute distress.  EYES: Pupils equal, round, reactive to light and accommodation. No scleral icterus. Extraocular muscles intact.  HEENT: Head atraumatic, normocephalic. Oropharynx and nasopharynx clear.  NECK:  Supple, no jugular venous distention. No thyroid enlargement, no  tenderness.  LUNGS: Normal breath sounds  bilaterally, no wheezing, rales,rhonchi or crepitation. No use of accessory muscles of respiration.  CARDIOVASCULAR: S1, S2 normal. No murmurs, rubs, or gallops.  ABDOMEN: Soft, nontender, nondistended. Bowel sounds present. No organomegaly or mass.  EXTREMITIES: No pedal edema, cyanosis, or clubbing.  NEUROLOGIC: Cranial nerves II through XII are intact. Muscle strength 5/5 in all extremities. Sensation intact. Gait not checked.  PSYCHIATRIC: The patient is alert and oriented x 3.  SKIN: No obvious rash, lesion, or ulcer.   Data Review     CBC w Diff: Lab Results  Component Value Date   WBC 8.5 11/04/2016   HGB 11.3 (L) 11/04/2016   HCT 31.1 (L) 11/04/2016   PLT 264 11/04/2016   LYMPHOPCT 12 11/01/2016   MONOPCT 7 11/01/2016   EOSPCT 1 11/01/2016   BASOPCT 0 11/01/2016   CMP: Lab Results  Component Value Date   NA 139 11/04/2016   K 3.7 11/04/2016   CL 110 11/04/2016   CO2 25 11/04/2016   BUN <5 (L) 11/04/2016   CREATININE 0.58 11/04/2016   PROT 6.1 (L) 10/31/2016   ALBUMIN 4.0 10/31/2016   BILITOT 1.3 (H) 10/31/2016   ALKPHOS 33 (L) 10/31/2016   AST 15 10/31/2016   ALT 19 10/31/2016  .  Micro Results Recent Results (from the past 240 hour(s))  Chlamydia/NGC rt PCR ([facility] only)     Status: None   Collection Time: 10/31/16  5:13 PM  Result Value Ref Range Status   Specimen source GC/Chlam ENDOCERVICAL  Final   Chlamydia Tr NOT DETECTED NOT DETECTED Final   N gonorrhoeae NOT DETECTED NOT DETECTED Final    Comment: (NOTE) 100  This methodology has not been evaluated in pregnant women or in 200  patients with a history of hysterectomy. 300 400  This methodology will not be performed on patients less than 20  years of age.   Wet prep, genital     Status: Abnormal   Collection Time: 10/31/16  5:13 PM  Result Value Ref Range Status   Yeast Wet Prep HPF POC NONE SEEN NONE SEEN Final   Trich, Wet Prep NONE SEEN NONE SEEN Final   Clue Cells Wet Prep HPF POC PRESENT  (A) NONE SEEN Final   WBC, Wet Prep HPF POC MODERATE (A) NONE SEEN Final   Sperm NONE SEEN  Final        Code Status Orders        Start     Ordered   10/31/16 2303  Full code  Continuous     10/31/16 2303    Code Status History    Date Active Date Inactive Code Status Order ID Comments User Context   This patient has a current code status but no historical code status.          Follow-up Information    Schedule an appointment as soon as possible for a visit with Wayland Denis, PA-C.   Specialty:  Physician Assistant Contact information: [place] [address] [phone]        Rubie Maid, MD Follow up in 1 week(s).   Specialties:  Obstetrics and Gynecology, Radiology Why:  hospital f/u Contact information: [address] [place] [address] [phone]           Discharge Medications   Allergies as of 11/04/2016      Reactions   Tape Rash      Medication List  TAKE these medications   HYDROcodone-acetaminophen 5-325 MG tablet Commonly known as:  NORCO Take 1 tablet by mouth every 4 (four) hours as needed for moderate pain.   metroNIDAZOLE 500 MG tablet Commonly known as:  FLAGYL Take 1 tablet (500 mg total) by mouth 2 (two) times daily.            Discharge Care Instructions        Start     Ordered   11/04/16 0000  HYDROcodone-acetaminophen (NORCO) 5-325 MG tablet  Every 4 hours PRN     11/04/16 0941   11/04/16 0000  metroNIDAZOLE (FLAGYL) 500 MG tablet  2 times daily     11/04/16 0941         Total Time in preparing paper work, data evaluation and todays exam - 35 minutes  Dustin Flock M.D on 11/04/2016 at 2:18 [facility]  [facility]   Office  [phone]

## 2016-11-05 ENCOUNTER — Encounter: Payer: Self-pay | Admitting: Gastroenterology

## 2016-11-05 LAB — SURGICAL PATHOLOGY

## 2016-11-13 ENCOUNTER — Ambulatory Visit (INDEPENDENT_AMBULATORY_CARE_PROVIDER_SITE_OTHER): Payer: BC Managed Care – PPO | Admitting: Obstetrics and Gynecology

## 2016-11-13 ENCOUNTER — Encounter: Payer: Self-pay | Admitting: Obstetrics and Gynecology

## 2016-11-13 VITALS — BP 124/81 | HR 72 | Ht 64.0 in | Wt 152.3 lb

## 2016-11-13 DIAGNOSIS — R102 Pelvic and perineal pain: Secondary | ICD-10-CM

## 2016-11-13 DIAGNOSIS — Z30011 Encounter for initial prescription of contraceptive pills: Secondary | ICD-10-CM | POA: Diagnosis not present

## 2016-11-13 NOTE — Progress Notes (Signed)
HPI:      [name] is a 41 y.o. [phone] who LMP was Patient's last menstrual period was 10/31/2016 (exact date).  Subjective:   She presents today For follow-up of pelvic pain. The pain is completely resolved at this time. During her workup she was found to have some type of inflammatory change throughout her abdomen. Ultrasound revealed a small cyst on the ovary possible endometrioma and a very small uterine fibroid. The patient feels completely well at this time but does not want that type of pain to occur again. She has a family history of endometriosis but has never been diagnosed herself. In fact she has never had painful menses or pelvic pain prior to this episode.    Hx: The following portions of the patient's history were reviewed and updated as appropriate:             She  has a past medical history of Endometriosis. She  does not have any pertinent problems on file. She  has a past surgical history that includes Bunionectomy (Bilateral, 2002 (left), 2009 (right)) and Colonoscopy with propofol (N/A, 11/02/2016). Her family history includes Breast cancer (age of onset: 32) in her maternal grandmother; CAD in her father; Endometriosis in her mother; Hypertension in her father and mother. She  reports that she has never smoked. She has never used smokeless tobacco. She reports that she does not drink alcohol or use drugs. She is allergic to tape.       Review of Systems:  Review of Systems  Constitutional: Denied constitutional symptoms, night sweats, recent illness, fatigue, fever, insomnia and weight loss.  Eyes: Denied eye symptoms, eye pain, photophobia, vision change and visual disturbance.  Ears/Nose/Throat/Neck: Denied ear, nose, throat or neck symptoms, hearing loss, nasal discharge, sinus congestion and sore throat.  Cardiovascular: Denied cardiovascular symptoms, arrhythmia, chest pain/pressure, edema, exercise intolerance, orthopnea and palpitations.  Respiratory:  Denied pulmonary symptoms, asthma, pleuritic pain, productive sputum, cough, dyspnea and wheezing.  Gastrointestinal: Denied, gastro-esophageal reflux, melena, nausea and vomiting.  Genitourinary: See HPI for additional information.  Musculoskeletal: Denied musculoskeletal symptoms, stiffness, swelling, muscle weakness and myalgia.  Dermatologic: Denied dermatology symptoms, rash and scar.  Neurologic: Denied neurology symptoms, dizziness, headache, neck pain and syncope.  Psychiatric: Denied psychiatric symptoms, anxiety and depression.  Endocrine: Denied endocrine symptoms including hot flashes and night sweats.   Meds:   No current outpatient prescriptions on file prior to visit.   No current facility-administered medications on file prior to visit.     Objective:     Vitals:   11/13/16 0807  BP: 124/81  Pulse: 72                Assessment:    [id number] Patient Active Problem List   Diagnosis Date Noted  . Abnormal CT scan, colon   . Abnormal abdominal CT scan   . Abdominal pain 10/31/2016  . Abdominal pain, RLQ      1. Pelvic pain in female   2. Initiation of OCP (BCP)     Small uterine fibroid and small ovarian cyst possible endometrioma.   Plan:            1.  We have discussed multiple strategies including expectant management, OCP cycling, and IUD. We have also discussed the need for a follow-up ultrasound in approximately 8 weeks. We have again discussed endometriosis in detail. Endometriosis We have discussed endometriosis in detail.  I informed her that endometriosis is a life-long, often progressive disease  that persists until menopause in many people.  I told her that endometriosis is often affected by hormones and that certain conditions resulting in changes of these hormones can treat endometriosis.  We have discussed the resultant inflammation, adhesions and damage to internal organs that can occur with endometriosis.  We have discussed the decreased  fertility often caused by this disease.  The type and timing of pain of endometriosis has also been discussed.  The possible treatment options from expectant management through major surgery have been reviewed, and each appropriate treatment discussed individually in relation to the extent of endometriosis.  I have given her literature on endometriosis and answered all her questions. She is unsure whether she would like to start OCPs-she would like to discuss it with her husband and inform us tomorrow. OCPs The risks /benefits of OCPs have been explained to the patient in detail.  Product literature has been given to her.  I have instructed her in the use of OCPs and have given her literature reinforcing this information.  I have explained to the patient that OCPs are not as effective for birth control during the first month of use, and that another form of contraception should be used during this time.  Both first-day start and Sunday start have been explained.  The risks and benefits of each was discussed.  She has been made aware of  the fact that other medications may affect the efficacy of OCPs.  I have answered all of her questions, and I believe that she has an understanding of the effectiveness and use of OCPs.  Orders No orders of the defined types were placed in this encounter.   No orders of the defined types were placed in this encounter.     F/U  Return in about 2 months (around 01/13/2017). I spent 17 minutes with this patient of which greater than 50% was spent discussing endometriosis, pelvic pain, ovarian cysts, uterine fibroids, OCPs.  Finis Bud, M.D. 11/13/2016 8:40 AM

## 2016-11-14 ENCOUNTER — Telehealth: Payer: Self-pay | Admitting: Obstetrics and Gynecology

## 2016-11-14 MED ORDER — DESOGESTREL-ETHINYL ESTRADIOL 0.15-0.02/0.01 MG (21/5) PO TABS: 1.0000 | ORAL_TABLET | Freq: Every day | ORAL | 2 refills | Status: DC

## 2016-11-14 NOTE — Telephone Encounter (Signed)
Informed pt script is at pharmacy.

## 2016-11-14 NOTE — Telephone Encounter (Signed)
Patient called stating she would like the medication she discussed with Dr Amalia Hailey yesterday sent to her pharmacy. Thanks

## 2016-12-31 ENCOUNTER — Other Ambulatory Visit: Payer: Self-pay | Admitting: Obstetrics and Gynecology

## 2016-12-31 DIAGNOSIS — R102 Pelvic and perineal pain: Secondary | ICD-10-CM

## 2017-01-08 ENCOUNTER — Ambulatory Visit (INDEPENDENT_AMBULATORY_CARE_PROVIDER_SITE_OTHER): Payer: BC Managed Care – PPO

## 2017-01-08 ENCOUNTER — Telehealth: Payer: Self-pay | Admitting: Obstetrics and Gynecology

## 2017-01-08 DIAGNOSIS — R102 Pelvic and perineal pain: Secondary | ICD-10-CM

## 2017-01-08 NOTE — Telephone Encounter (Signed)
Patient would like to switch to the 3 month BC pills - she has like 3 pills left in this pack   Pharm - [facility]

## 2017-01-09 NOTE — Telephone Encounter (Signed)
Spoke with pt- her request to switch OCPs will be addressed at her appointment 01/16/17 per provider. Pt agreed.

## 2017-01-16 ENCOUNTER — Ambulatory Visit (INDEPENDENT_AMBULATORY_CARE_PROVIDER_SITE_OTHER): Payer: BC Managed Care – PPO | Admitting: Obstetrics and Gynecology

## 2017-01-16 ENCOUNTER — Encounter: Payer: BC Managed Care – PPO | Admitting: Obstetrics and Gynecology

## 2017-01-16 ENCOUNTER — Encounter: Payer: Self-pay | Admitting: Obstetrics and Gynecology

## 2017-01-16 VITALS — BP 117/76 | HR 74 | Ht 64.0 in | Wt 147.5 lb

## 2017-01-16 DIAGNOSIS — D251 Intramural leiomyoma of uterus: Secondary | ICD-10-CM | POA: Diagnosis not present

## 2017-01-16 DIAGNOSIS — N83201 Unspecified ovarian cyst, right side: Secondary | ICD-10-CM

## 2017-01-16 DIAGNOSIS — N83202 Unspecified ovarian cyst, left side: Secondary | ICD-10-CM | POA: Diagnosis not present

## 2017-01-16 DIAGNOSIS — R102 Pelvic and perineal pain unspecified side: Secondary | ICD-10-CM

## 2017-01-16 NOTE — Progress Notes (Signed)
HPI:      Shelly Middleton is a 41 y.o. [phone] who LMP was Patient's last menstrual period was 01/07/2017 (exact date).  Subjective:   She presents today for follow-up of pelvic pain caused by uterine fibroids and ovarian cysts.  She has tried OCPs and continues to take them but has noticed no change in her pain.  She says that at least 2 weeks sometimes 3 weeks/month are painful.  She takes Motrin on a regular basis to control this pain.  She is specifically requesting definitive management -hysterectomy.    Hx: The following portions of the patient's history were reviewed and updated as appropriate:             She  has a past medical history of Endometriosis. She does not have any pertinent problems on file. She  has a past surgical history that includes Bunionectomy (Bilateral, 2002 (left), 2009 (right)) and Colonoscopy with propofol (N/A, 11/02/2016). Her family history includes Breast cancer (age of onset: 68) in her maternal grandmother; CAD in her father; Endometriosis in her mother; Hypertension in her father and mother. She  reports that  has never smoked. she has never used smokeless tobacco. She reports that she does not drink alcohol or use drugs. She has a current medication list which includes the following prescription(s): desogestrel-ethinyl estradiol, fluoxetine, and triamcinolone cream. She is allergic to tape.       Review of Systems:  Review of Systems  Constitutional: Denied constitutional symptoms, night sweats, recent illness, fatigue, fever, insomnia and weight loss.  Eyes: Denied eye symptoms, eye pain, photophobia, vision change and visual disturbance.  Ears/Nose/Throat/Neck: Denied ear, nose, throat or neck symptoms, hearing loss, nasal discharge, sinus congestion and sore throat.  Cardiovascular: Denied cardiovascular symptoms, arrhythmia, chest pain/pressure, edema, exercise intolerance, orthopnea and palpitations.  Respiratory: Denied pulmonary symptoms, asthma,  pleuritic pain, productive sputum, cough, dyspnea and wheezing.  Gastrointestinal: Denied, gastro-esophageal reflux, melena, nausea and vomiting.  Genitourinary: Denied genitourinary symptoms including symptomatic vaginal discharge, pelvic relaxation issues, and urinary complaints.  Musculoskeletal: Denied musculoskeletal symptoms, stiffness, swelling, muscle weakness and myalgia.  Dermatologic: Denied dermatology symptoms, rash and scar.  Neurologic: Denied neurology symptoms, dizziness, headache, neck pain and syncope.  Psychiatric: Denied psychiatric symptoms, anxiety and depression.  Endocrine: Denied endocrine symptoms including hot flashes and night sweats.   Meds:   Current Outpatient Medications on File Prior to Visit  Medication Sig Dispense Refill  . desogestrel-ethinyl estradiol (VIORELE) 0.15-0.02/0.01 MG (21/5) tablet Take by mouth.    Marland Kitchen FLUoxetine (PROZAC) 20 MG capsule   3  . triamcinolone cream (KENALOG) 0.1 % APP EXT AA BID  0   No current facility-administered medications on file prior to visit.     Objective:     Vitals:   01/16/17 1130  BP: 117/76  Pulse: 74              Ultrasound results reviewed directly with the patient.  Ovarian cysts and size of the uterus specifically discussed.  Assessment:    [id number] Patient Active Problem List   Diagnosis Date Noted  . Abnormal CT scan, colon   . Abnormal abdominal CT scan   . Abdominal pain 10/31/2016  . Abdominal pain, RLQ      1. Pelvic pain in female   2. Fibroids, intramural   3. Bilateral ovarian cysts     Cysts have slightly increased in size and the patient's pain has remained constant despite OCPs.   Plan:  1.  Patient would like her surgery in January.  2.  Return for preop in December   LAVH BSO. Orders No orders of the defined types were placed in this encounter.   No orders of the defined types were placed in this encounter.     F/U  Return in about 3 weeks (around  02/06/2017).  Finis Bud, M.D. 01/16/2017 12:22 PM

## 2017-02-03 ENCOUNTER — Other Ambulatory Visit: Payer: Self-pay | Admitting: Obstetrics and Gynecology

## 2017-02-06 ENCOUNTER — Ambulatory Visit (INDEPENDENT_AMBULATORY_CARE_PROVIDER_SITE_OTHER): Payer: BC Managed Care – PPO | Admitting: Obstetrics and Gynecology

## 2017-02-06 ENCOUNTER — Encounter: Payer: Self-pay | Admitting: Obstetrics and Gynecology

## 2017-02-06 VITALS — BP 105/71 | HR 89 | Ht 64.0 in | Wt 146.1 lb

## 2017-02-06 DIAGNOSIS — D251 Intramural leiomyoma of uterus: Secondary | ICD-10-CM

## 2017-02-06 DIAGNOSIS — Z01818 Encounter for other preprocedural examination: Secondary | ICD-10-CM | POA: Diagnosis not present

## 2017-02-06 DIAGNOSIS — R102 Pelvic and perineal pain: Secondary | ICD-10-CM | POA: Diagnosis not present

## 2017-02-06 MED ORDER — METRONIDAZOLE 500 MG PO TABS: 500.0000 mg | ORAL_TABLET | Freq: Two times a day (BID) | ORAL | 0 refills | Status: DC

## 2017-02-06 NOTE — H&P (Signed)
PRE-OPERATIVE HISTORY AND PHYSICAL EXAM  PCP:  Wayland Denis, PA-C Subjective:   HPI:  Shelly Middleton is a 41 y.o. [id number].  Patient's last menstrual period was 02/05/2017 (exact date).  She presents today for a pre-op discussion and PE.  She has the following symptoms: She has uterine fibroids and small ovarian cysts.  She has been experiencing pelvic pain for many years.  It has become worse lately.  She has tried OCPs for cycle and pain control and these have not helped.  She is tired of having pain and bleeding.  Review of Systems:   Constitutional: Denied constitutional symptoms, night sweats, recent illness, fatigue, fever, insomnia and weight loss.  Eyes: Denied eye symptoms, eye pain, photophobia, vision change and visual disturbance.  Ears/Nose/Throat/Neck: Denied ear, nose, throat or neck symptoms, hearing loss, nasal discharge, sinus congestion and sore throat.  Cardiovascular: Denied cardiovascular symptoms, arrhythmia, chest pain/pressure, edema, exercise intolerance, orthopnea and palpitations.  Respiratory: Denied pulmonary symptoms, asthma, pleuritic pain, productive sputum, cough, dyspnea and wheezing.  Gastrointestinal: Denied, gastro-esophageal reflux, melena, nausea and vomiting.  Genitourinary: See HPI for additional information.  Musculoskeletal: Denied musculoskeletal symptoms, stiffness, swelling, muscle weakness and myalgia.  Dermatologic: Denied dermatology symptoms, rash and scar.  Neurologic: Denied neurology symptoms, dizziness, headache, neck pain and syncope.  Psychiatric: Denied psychiatric symptoms, anxiety and depression.  Endocrine: Denied endocrine symptoms including hot flashes and night sweats.   OB History  Gravida Para Term Preterm AB Living  4 4 4     4   SAB TAB Ectopic Multiple Live Births          4    # Outcome Date GA Lbr Len/2nd Weight Sex Delivery Anes PTL Lv  4 Term 2007    F Vag-Spont  N LIV  3 Term 2004    F Vag-Spont  N LIV    2 Term 2002    F Vag-Spont  N LIV  1 Term 2000    F Vag-Spont  N LIV      Past Medical History:  Diagnosis Date  . Endometriosis     Past Surgical History:  Procedure Laterality Date  . BUNIONECTOMY Bilateral 2002 (left), 2009 (right)  . COLONOSCOPY WITH PROPOFOL N/A 11/02/2016   Procedure: COLONOSCOPY WITH PROPOFOL;  Surgeon: Lucilla Lame, MD;  Location: [facility] ENDOSCOPY;  Service: Endoscopy;  Laterality: N/A;      SOCIAL HISTORY: Social History   Tobacco Use  Smoking Status Never Smoker  Smokeless Tobacco Never Used   Social History   Substance and Sexual Activity  Alcohol Use No   Social History   Substance and Sexual Activity  Drug Use No    Family History  Problem Relation Age of Onset  . Hypertension Mother   . Endometriosis Mother   . Hypertension Father   . CAD Father   . Breast cancer Maternal Grandmother 78       and possibly 1 or 2 of grandmother's sisters    ALLERGIES:  Tape  MEDS:   Current Outpatient Medications on File Prior to Visit  Medication Sig Dispense Refill  . desogestrel-ethinyl estradiol (VIORELE) 0.15-0.02/0.01 MG (21/5) tablet Take by mouth.    Marland Kitchen FLUoxetine (PROZAC) 20 MG capsule   3  . triamcinolone cream (KENALOG) 0.1 % APP EXT AA BID  0   No current facility-administered medications on file prior to visit.     Meds ordered this encounter  Medications  . metroNIDAZOLE (FLAGYL) 500 MG tablet  Sig: Take 1 tablet (500 mg total) by mouth 2 (two) times daily. Begin 5 days prior to scheduled surgery as directed.    Dispense:  10 tablet    Refill:  0     Physical examination BP 105/71   Pulse 89   Ht 5\' 4"  (1.626 m)   Wt 146 lb 2 oz (66.3 kg)   LMP 02/05/2017 (Exact Date)   BMI 25.08 kg/m   General NAD, Conversant  HEENT Atraumatic; Op clear with mmm.  Normo-cephalic. Pupils reactive. Anicteric sclerae  Thyroid/Neck Smooth without nodularity or enlargement. Normal ROM.  Neck Supple.  Skin No rashes, lesions or  ulceration. Normal palpated skin turgor. No nodularity.  Breasts: No masses or discharge.  Symmetric.  No axillary adenopathy.  Lungs: Clear to auscultation.No rales or wheezes. Normal Respiratory effort, no retractions.  Heart: NSR.  No murmurs or rubs appreciated. No periferal edema  Abdomen: Soft.  Non-tender.  No masses.  No HSM. No hernia  Extremities: Moves all appropriately.  Normal ROM for age. No lymphadenopathy.  Neuro: Oriented to PPT.  Normal mood. Normal affect.     Pelvic:   Vulva: Normal appearance.  No lesions.  Vagina: No lesions or abnormalities noted.  Support: Normal pelvic support.  Urethra No masses tenderness or scarring.  Meatus Normal size without lesions or prolapse.  Cervix: Normal ectropion.  No lesions.  Anus: Normal exam.  No lesions.  Perineum: Normal exam.  No lesions.        Bimanual   Uterus:  Top normal size non-tender.  Mobile.  AV.  Adnexae: No masses.  Non-tender to palpation.  Cul-de-sac: Negative for abnormality.   Assessment:   [id number] Patient Active Problem List   Diagnosis Date Noted  . Abnormal CT scan, colon   . Abnormal abdominal CT scan   . Abdominal pain 10/31/2016  . Abdominal pain, RLQ     1. Preop examination   2. Pelvic pain in female   3. Fibroids, intramural      Plan:   Orders: Meds ordered this encounter  Medications  . metroNIDAZOLE (FLAGYL) 500 MG tablet    Sig: Take 1 tablet (500 mg total) by mouth 2 (two) times daily. Begin 5 days prior to scheduled surgery as directed.    Dispense:  10 tablet    Refill:  0     1.  laparoscopic assisted vaginal hysterectomy BSO

## 2017-02-06 NOTE — Progress Notes (Signed)
PRE-OPERATIVE HISTORY AND PHYSICAL EXAM  PCP:  Wayland Denis, PA-C Subjective:   HPI:  Shelly Middleton is a 41 y.o. [id number].  Patient's last menstrual period was 02/05/2017 (exact date).  She presents today for a pre-op discussion and PE.  She has the following symptoms: She has uterine fibroids and small ovarian cysts.  She has been experiencing pelvic pain for many years.  It has become worse lately.  She has tried OCPs for cycle and pain control and these have not helped.  She is tired of having pain and bleeding.  Review of Systems:   Constitutional: Denied constitutional symptoms, night sweats, recent illness, fatigue, fever, insomnia and weight loss.  Eyes: Denied eye symptoms, eye pain, photophobia, vision change and visual disturbance.  Ears/Nose/Throat/Neck: Denied ear, nose, throat or neck symptoms, hearing loss, nasal discharge, sinus congestion and sore throat.  Cardiovascular: Denied cardiovascular symptoms, arrhythmia, chest pain/pressure, edema, exercise intolerance, orthopnea and palpitations.  Respiratory: Denied pulmonary symptoms, asthma, pleuritic pain, productive sputum, cough, dyspnea and wheezing.  Gastrointestinal: Denied, gastro-esophageal reflux, melena, nausea and vomiting.  Genitourinary: See HPI for additional information.  Musculoskeletal: Denied musculoskeletal symptoms, stiffness, swelling, muscle weakness and myalgia.  Dermatologic: Denied dermatology symptoms, rash and scar.  Neurologic: Denied neurology symptoms, dizziness, headache, neck pain and syncope.  Psychiatric: Denied psychiatric symptoms, anxiety and depression.  Endocrine: Denied endocrine symptoms including hot flashes and night sweats.   OB History  Gravida Para Term Preterm AB Living  4 4 4     4   SAB TAB Ectopic Multiple Live Births          4    # Outcome Date GA Lbr Len/2nd Weight Sex Delivery Anes PTL Lv  4 Term 2007    F Vag-Spont  N LIV  3 Term 2004    F Vag-Spont  N LIV    2 Term 2002    F Vag-Spont  N LIV  1 Term 2000    F Vag-Spont  N LIV      Past Medical History:  Diagnosis Date  . Endometriosis     Past Surgical History:  Procedure Laterality Date  . BUNIONECTOMY Bilateral 2002 (left), 2009 (right)  . COLONOSCOPY WITH PROPOFOL N/A 11/02/2016   Procedure: COLONOSCOPY WITH PROPOFOL;  Surgeon: Lucilla Lame, MD;  Location: [facility] ENDOSCOPY;  Service: Endoscopy;  Laterality: N/A;      SOCIAL HISTORY: Social History   Tobacco Use  Smoking Status Never Smoker  Smokeless Tobacco Never Used   Social History   Substance and Sexual Activity  Alcohol Use No   Social History   Substance and Sexual Activity  Drug Use No    Family History  Problem Relation Age of Onset  . Hypertension Mother   . Endometriosis Mother   . Hypertension Father   . CAD Father   . Breast cancer Maternal Grandmother 78       and possibly 1 or 2 of grandmother's sisters    ALLERGIES:  Tape  MEDS:   Current Outpatient Medications on File Prior to Visit  Medication Sig Dispense Refill  . desogestrel-ethinyl estradiol (VIORELE) 0.15-0.02/0.01 MG (21/5) tablet Take by mouth.    Marland Kitchen FLUoxetine (PROZAC) 20 MG capsule   3  . triamcinolone cream (KENALOG) 0.1 % APP EXT AA BID  0   No current facility-administered medications on file prior to visit.     Meds ordered this encounter  Medications  . metroNIDAZOLE (FLAGYL) 500 MG tablet  Sig: Take 1 tablet (500 mg total) by mouth 2 (two) times daily. Begin 5 days prior to scheduled surgery as directed.    Dispense:  10 tablet    Refill:  0     Physical examination BP 105/71   Pulse 89   Ht 5\' 4"  (1.626 m)   Wt 146 lb 2 oz (66.3 kg)   LMP 02/05/2017 (Exact Date)   BMI 25.08 kg/m   General NAD, Conversant  HEENT Atraumatic; Op clear with mmm.  Normo-cephalic. Pupils reactive. Anicteric sclerae  Thyroid/Neck Smooth without nodularity or enlargement. Normal ROM.  Neck Supple.  Skin No rashes, lesions or  ulceration. Normal palpated skin turgor. No nodularity.  Breasts: No masses or discharge.  Symmetric.  No axillary adenopathy.  Lungs: Clear to auscultation.No rales or wheezes. Normal Respiratory effort, no retractions.  Heart: NSR.  No murmurs or rubs appreciated. No periferal edema  Abdomen: Soft.  Non-tender.  No masses.  No HSM. No hernia  Extremities: Moves all appropriately.  Normal ROM for age. No lymphadenopathy.  Neuro: Oriented to PPT.  Normal mood. Normal affect.     Pelvic:   Vulva: Normal appearance.  No lesions.  Vagina: No lesions or abnormalities noted.  Support: Normal pelvic support.  Urethra No masses tenderness or scarring.  Meatus Normal size without lesions or prolapse.  Cervix: Normal ectropion.  No lesions.  Anus: Normal exam.  No lesions.  Perineum: Normal exam.  No lesions.        Bimanual   Uterus:  Top normal size non-tender.  Mobile.  AV.  Adnexae: No masses.  Non-tender to palpation.  Cul-de-sac: Negative for abnormality.   Assessment:   [id number] Patient Active Problem List   Diagnosis Date Noted  . Abnormal CT scan, colon   . Abnormal abdominal CT scan   . Abdominal pain 10/31/2016  . Abdominal pain, RLQ     1. Preop examination   2. Pelvic pain in female   3. Fibroids, intramural      Plan:   Orders: Meds ordered this encounter  Medications  . metroNIDAZOLE (FLAGYL) 500 MG tablet    Sig: Take 1 tablet (500 mg total) by mouth 2 (two) times daily. Begin 5 days prior to scheduled surgery as directed.    Dispense:  10 tablet    Refill:  0     1.  laparoscopic assisted vaginal hysterectomy BSO  Pre-op discussions regarding Risks and Benefits of her scheduled surgery.  LAVH The procedure of Laparoscopic Assisted Vaginal Hysterectomy was described to the patient in detail.  We reviewed the rationale for Hysterectomy and the patient was again informed of other nonsurgical management possibilities for her condition.  She has considered  these other options, and desires a Hysterectomy.  We have reviewed the fact that Hysterectomy is permanent and that following the procedure she will not be able to become pregnant or bear children.  We have discussed the following risk factors specifically and the patient has also been informed that additional complications not mentioned may develop:  Damage to bowel, bladder, ureters or to other internal organs, bleeding, infection and the risk from anesthesia.  We have discussed the procedure itself in detail and she has an informed understanding of this surgery.  We have also discussed the recovery period in which physical and sexual activity will be restricted for a varying degree of time, often 3 - 6 weeks. The Laparoscopic Portion of Hysterectomy has also been reviewed with the patient.  She  understands how the laparoscope facilitates the procedure.  We have discussed the abdominal incisions and punctures that will be used.  We have also reviewed the increased Operating Room time often accompanying LAVH.  The slightly increased risk of complications secondary to abdominal punctures, and use of laparoscopic instrumentation has also been discussed in detail.I have answered all of her questions and I believe the patient has an informed understanding of the procedure of Laparoscopic Assisted Vaginal Hysterectomy.  Oophorectomy The option of Oophorectomy has been discussed with the patient.  Detailed risk/benefits have been reviewed.  The risks discussed include, but are not limited to, hemorrhage, infection, damage to ureter or other internal organ, and Ovarian Remnant Syndrome.  The benefits include a significant decrease in the risk of Ovarian Cancer and in benign Ovarian disease.  The risk of Ovarian CA has been estimated at 1 in 28.  This is a relatively small risk.  However, should Ovarian CA develop, it is often found late in the course of the disease.  We have also discussed the role of inheritance in  the development of Ovarian disease.  Some women, who have close relatives with Ovarian CA, have a higher than 1 in 70 risk of Ovarian CA.  The benefits of Estrogen replacement therapy following Oophorectomy has been stressed.  If she is premenopausal, we have discussed the fact that this procedure will make her permanently sterile and that premature menopause will result if no ERT is begun.  I have answered all of her questions, and I believe that she has an adequate and informed understanding of the risks and benefits of Oophorectomy. Patient has specifically requested oophorectomy.  Finis Bud, M.D. 02/06/2017 4:19 PM

## 2017-02-07 NOTE — Telephone Encounter (Signed)
Fax received from [facility] requesting a refill on Viorele. Message left on pts voicemail to please return call to confirm if she needs this refilled. As per provider note- pt states OCPs not helping with her problem.

## 2017-02-14 ENCOUNTER — Telehealth: Payer: Self-pay

## 2017-02-14 NOTE — Telephone Encounter (Signed)
Spoke with pt- she does not want to refill viorele.

## 2017-03-06 ENCOUNTER — Telehealth: Payer: Self-pay | Admitting: *Deleted

## 2017-03-06 ENCOUNTER — Other Ambulatory Visit: Payer: Self-pay

## 2017-03-06 ENCOUNTER — Encounter
Admission: RE | Admit: 2017-03-06 | Discharge: 2017-03-06 | Disposition: A | Discharge status: Home / Self Care | Payer: BC Managed Care – PPO | Source: Ambulatory Visit | Attending: Obstetrics and Gynecology | Admitting: Obstetrics and Gynecology

## 2017-03-06 ENCOUNTER — Telehealth: Payer: Self-pay

## 2017-03-06 NOTE — Telephone Encounter (Signed)
Patient states that Dr. Amalia Hailey suppose to send a rx for an antibiotic to her pharmacy before her surgery. Patient states she has not received  that antibiotic . Patient states that her surgery is on 03/11/17. Patient pharmacy [facility] on [address]. Patient is requesting a call back. Her contact # [phone]

## 2017-03-06 NOTE — Telephone Encounter (Signed)
Voicemail left for pt- script was sent 02/06/18 with receipt from pharmacy.

## 2017-03-06 NOTE — Patient Instructions (Signed)
  Your procedure is scheduled on: 03-11-17 MONDAY Report to Same Day Surgery 2nd floor medical mall [facility] Entrance-take elevator on left to 2nd floor.  Check in with surgery information desk.) To find out your arrival time please call [phone] between 1PM - 3PM on 03-08-17 FRIDAY  Remember: Instructions that are not followed completely may result in serious medical risk, up to and including death, or upon the discretion of your surgeon and anesthesiologist your surgery may need to be rescheduled.    _x___ 1. Do not eat food after midnight the night before your procedure. NO GUM OR CANDY AFTER MIDNIGHT.  You may drink clear liquids up to 2 hours before you are scheduled to arrive at the hospital for your procedure.  Do not drink clear liquids within 2 hours of your scheduled arrival to the hospital.  Clear liquids include  --Water or Apple juice without pulp  --Clear carbohydrate beverage such as ClearFast or Gatorade  --Black Coffee or Clear Tea (No milk, no creamers, do not add anything to  the coffee or Tea     __x__ 2. No Alcohol for 24 hours before or after surgery.   __x__3. No Smoking for 24 prior to surgery.   ____  4. Bring all medications with you on the day of surgery if instructed.    __x__ 5. Notify your doctor if there is any change in your medical condition     (cold, fever, infections).     Do not wear jewelry, make-up, hairpins, clips or nail polish.  Do not wear lotions, powders, or perfumes. You may wear deodorant.  Do not shave 48 hours prior to surgery. Men may shave face and neck.  Do not bring valuables to the hospital.    [facility] is not responsible for any belongings or valuables.               Contacts, dentures or bridgework may not be worn into surgery.  Leave your suitcase in the car. After surgery it may be brought to your room.  For patients admitted to the hospital, discharge time is determined by your treatment team.   Patients  discharged the day of surgery will not be allowed to drive home.  You will need someone to drive you home and stay with you the night of your procedure.    Please read over the following fact sheets that you were given:   [facility] Preparing for Surgery and or MRSA Information   _x___ TAKE THE FOLLOWING MEDICATION THE MORNING OF SURGERY WITH A SMALL SIP OF WATER. These include:  1. PROZAC (FLUOXETINE)  2.  3.  4.  5.  6.  ____Fleets enema or Magnesium Citrate as directed.   _x___ Use CHG Soap or sage wipes as directed on instruction sheet   ____ Use inhalers on the day of surgery and bring to hospital day of surgery  ____ Stop Metformin and Janumet 2 days prior to surgery.    ____ Take 1/2 of usual insulin dose the night before surgery and none on the morning surgery.   _x___ Follow recommendations from Cardiologist, Pulmonologist or PCP regarding  stopping Aspirin, Coumadin, Plavix ,Eliquis, Effient, or Pradaxa, and Pletal.  X____Stop Anti-inflammatories such as Advil, Aleve, Ibuprofen, Motrin, Naproxen, Naprosyn, Goodies powders or aspirin products NOW-OK to take Tylenol    ____ Stop supplements until after surgery.    ____ Bring C-Pap to the hospital.

## 2017-03-08 ENCOUNTER — Encounter
Admission: RE | Admit: 2017-03-08 | Discharge: 2017-03-08 | Disposition: A | Discharge status: Home / Self Care | Payer: BC Managed Care – PPO | Source: Ambulatory Visit | Attending: Obstetrics and Gynecology | Admitting: Obstetrics and Gynecology

## 2017-03-08 DIAGNOSIS — Z79899 Other long term (current) drug therapy: Secondary | ICD-10-CM | POA: Diagnosis not present

## 2017-03-08 DIAGNOSIS — N736 Female pelvic peritoneal adhesions (postinfective): Secondary | ICD-10-CM | POA: Diagnosis not present

## 2017-03-08 DIAGNOSIS — D251 Intramural leiomyoma of uterus: Secondary | ICD-10-CM | POA: Diagnosis present

## 2017-03-08 DIAGNOSIS — Z01812 Encounter for preprocedural laboratory examination: Secondary | ICD-10-CM | POA: Diagnosis not present

## 2017-03-08 DIAGNOSIS — Z803 Family history of malignant neoplasm of breast: Secondary | ICD-10-CM | POA: Diagnosis not present

## 2017-03-08 DIAGNOSIS — N946 Dysmenorrhea, unspecified: Secondary | ICD-10-CM | POA: Diagnosis not present

## 2017-03-08 DIAGNOSIS — N803 Endometriosis of pelvic peritoneum: Secondary | ICD-10-CM | POA: Diagnosis not present

## 2017-03-08 DIAGNOSIS — Z8249 Family history of ischemic heart disease and other diseases of the circulatory system: Secondary | ICD-10-CM | POA: Diagnosis not present

## 2017-03-08 LAB — TYPE AND SCREEN
ABO/RH(D): A POS
Antibody Screen: NEGATIVE

## 2017-03-08 LAB — CBC
HCT: 38 % (ref 35.0–47.0)
Hemoglobin: 13.4 g/dL (ref 12.0–16.0)
MCH: 31.9 pg (ref 26.0–34.0)
MCHC: 35.3 g/dL (ref 32.0–36.0)
MCV: 90.3 fL (ref 80.0–100.0)
PLATELETS: 298 10*3/uL (ref 150–440)
RBC: 4.21 MIL/uL (ref 3.80–5.20)
RDW: 12.8 % (ref 11.5–14.5)
WBC: 9.1 10*3/uL (ref 3.6–11.0)

## 2017-03-08 LAB — HCG, QUANTITATIVE, PREGNANCY: hCG, Beta Chain, Quant, S: <1 m[IU]/mL (ref <5)

## 2017-03-10 MED ORDER — CEFAZOLIN SODIUM-DEXTROSE 2-4 GM/100ML-% IV SOLN
2.0000 g | INTRAVENOUS | Status: AC
  Administered 2017-03-11: 2 g via INTRAVENOUS
  Filled 2017-03-10: qty 100

## 2017-03-11 ENCOUNTER — Ambulatory Visit: Payer: BC Managed Care – PPO | Admitting: Certified Registered"

## 2017-03-11 ENCOUNTER — Encounter: Payer: Self-pay | Admitting: *Deleted

## 2017-03-11 ENCOUNTER — Observation Stay
Admission: AD | Admit: 2017-03-11 | Discharge: 2017-03-13 | DRG: 743 | Disposition: A | Discharge status: Home / Self Care | Payer: BC Managed Care – PPO | Source: Ambulatory Visit | Attending: Obstetrics and Gynecology | Admitting: Obstetrics and Gynecology

## 2017-03-11 ENCOUNTER — Encounter
Admission: AD | Disposition: A | Discharge status: Home / Self Care | Payer: Self-pay | Source: Ambulatory Visit | Attending: Obstetrics and Gynecology

## 2017-03-11 ENCOUNTER — Other Ambulatory Visit: Payer: Self-pay

## 2017-03-11 DIAGNOSIS — N736 Female pelvic peritoneal adhesions (postinfective): Secondary | ICD-10-CM

## 2017-03-11 DIAGNOSIS — Z8249 Family history of ischemic heart disease and other diseases of the circulatory system: Secondary | ICD-10-CM | POA: Insufficient documentation

## 2017-03-11 DIAGNOSIS — Z79899 Other long term (current) drug therapy: Secondary | ICD-10-CM | POA: Insufficient documentation

## 2017-03-11 DIAGNOSIS — D251 Intramural leiomyoma of uterus: Secondary | ICD-10-CM

## 2017-03-11 DIAGNOSIS — Z803 Family history of malignant neoplasm of breast: Secondary | ICD-10-CM | POA: Insufficient documentation

## 2017-03-11 DIAGNOSIS — Z9889 Other specified postprocedural states: Secondary | ICD-10-CM

## 2017-03-11 DIAGNOSIS — N946 Dysmenorrhea, unspecified: Secondary | ICD-10-CM | POA: Insufficient documentation

## 2017-03-11 DIAGNOSIS — Z01812 Encounter for preprocedural laboratory examination: Secondary | ICD-10-CM | POA: Insufficient documentation

## 2017-03-11 DIAGNOSIS — N803 Endometriosis of pelvic peritoneum: Secondary | ICD-10-CM | POA: Insufficient documentation

## 2017-03-11 HISTORY — PX: LAPAROSCOPIC VAGINAL HYSTERECTOMY WITH SALPINGO OOPHORECTOMY: SHX6681

## 2017-03-11 LAB — ABO/RH: ABO/RH(D): A POS

## 2017-03-11 SURGERY — HYSTERECTOMY, VAGINAL, LAPAROSCOPY-ASSISTED, WITH SALPINGO-OOPHORECTOMY
Anesthesia: General | Laterality: Bilateral

## 2017-03-11 MED ORDER — ONDANSETRON HCL 4 MG/2ML IJ SOLN
4.0000 mg | Freq: Once | INTRAMUSCULAR | Status: AC | PRN
  Administered 2017-03-11: 4 mg via INTRAVENOUS

## 2017-03-11 MED ORDER — PHENYLEPHRINE HCL 10 MG/ML IJ SOLN
INTRAMUSCULAR | Status: DC | PRN
  Administered 2017-03-11 (×3): 100 ug via INTRAVENOUS

## 2017-03-11 MED ORDER — PROMETHAZINE HCL 25 MG/ML IJ SOLN
INTRAMUSCULAR | Status: AC
  Administered 2017-03-11: 6.25 mg via INTRAVENOUS
  Filled 2017-03-11: qty 1

## 2017-03-11 MED ORDER — SODIUM CHLORIDE FLUSH 0.9 % IV SOLN
INTRAVENOUS | Status: AC
  Filled 2017-03-11: qty 10

## 2017-03-11 MED ORDER — LIDOCAINE HCL (CARDIAC) 20 MG/ML IV SOLN
INTRAVENOUS | Status: DC | PRN
  Administered 2017-03-11: 100 mg via INTRAVENOUS

## 2017-03-11 MED ORDER — ALBUMIN HUMAN 5 % IV SOLN
INTRAVENOUS | Status: DC | PRN
Start: 2017-03-11 — End: 2017-03-11
  Administered 2017-03-11: 09:00:00 via INTRAVENOUS

## 2017-03-11 MED ORDER — ALBUMIN HUMAN 5 % IV SOLN
INTRAVENOUS | Status: AC
  Filled 2017-03-11: qty 250

## 2017-03-11 MED ORDER — PROMETHAZINE HCL 25 MG/ML IJ SOLN
25.0000 mg | Freq: Four times a day (QID) | INTRAMUSCULAR | Status: DC | PRN
  Administered 2017-03-11: 25 mg via INTRAVENOUS
  Filled 2017-03-11: qty 1

## 2017-03-11 MED ORDER — FAMOTIDINE 20 MG PO TABS
ORAL_TABLET | ORAL | Status: AC
  Administered 2017-03-11: 20 mg via ORAL
  Filled 2017-03-11: qty 1

## 2017-03-11 MED ORDER — KETOROLAC TROMETHAMINE 30 MG/ML IJ SOLN
30.0000 mg | Freq: Once | INTRAMUSCULAR | Status: AC
  Filled 2017-03-11: qty 1

## 2017-03-11 MED ORDER — KETOROLAC TROMETHAMINE 30 MG/ML IJ SOLN: 30.0000 mg | Freq: Four times a day (QID) | INTRAMUSCULAR | Status: DC

## 2017-03-11 MED ORDER — GLYCOPYRROLATE 0.2 MG/ML IJ SOLN
INTRAMUSCULAR | Status: DC | PRN
  Administered 2017-03-11: 0.2 mg via INTRAVENOUS

## 2017-03-11 MED ORDER — ONDANSETRON HCL 4 MG/2ML IJ SOLN
INTRAMUSCULAR | Status: AC
  Administered 2017-03-11: 4 mg via INTRAVENOUS
  Filled 2017-03-11: qty 2

## 2017-03-11 MED ORDER — HYDROMORPHONE HCL 1 MG/ML IJ SOLN
0.2000 mg | INTRAMUSCULAR | Status: DC | PRN
  Administered 2017-03-11 – 2017-03-12 (×6): 0.4 mg via INTRAVENOUS
  Filled 2017-03-11 (×6): qty 1

## 2017-03-11 MED ORDER — FENTANYL CITRATE (PF) 100 MCG/2ML IJ SOLN: 25.0000 ug | INTRAMUSCULAR | Status: DC | PRN

## 2017-03-11 MED ORDER — PROPOFOL 10 MG/ML IV BOLUS
INTRAVENOUS | Status: AC
  Filled 2017-03-11: qty 20

## 2017-03-11 MED ORDER — FAMOTIDINE 20 MG PO TABS
20.0000 mg | ORAL_TABLET | Freq: Once | ORAL | Status: AC
  Administered 2017-03-11: 20 mg via ORAL

## 2017-03-11 MED ORDER — ONDANSETRON HCL 4 MG/2ML IJ SOLN
INTRAMUSCULAR | Status: DC | PRN
  Administered 2017-03-11: 4 mg via INTRAVENOUS

## 2017-03-11 MED ORDER — MIDAZOLAM HCL 2 MG/2ML IJ SOLN
INTRAMUSCULAR | Status: AC
  Filled 2017-03-11: qty 2

## 2017-03-11 MED ORDER — FENTANYL CITRATE (PF) 100 MCG/2ML IJ SOLN
INTRAMUSCULAR | Status: AC
  Filled 2017-03-11: qty 2

## 2017-03-11 MED ORDER — SIMETHICONE 80 MG PO CHEW
80.0000 mg | CHEWABLE_TABLET | Freq: Four times a day (QID) | ORAL | Status: DC | PRN
Start: 2017-03-11 — End: 2017-03-13

## 2017-03-11 MED ORDER — PROPOFOL 10 MG/ML IV BOLUS
INTRAVENOUS | Status: DC | PRN
  Administered 2017-03-11: 100 mg via INTRAVENOUS

## 2017-03-11 MED ORDER — KETOROLAC TROMETHAMINE 30 MG/ML IJ SOLN
30.0000 mg | Freq: Four times a day (QID) | INTRAMUSCULAR | Status: DC
  Administered 2017-03-11 – 2017-03-12 (×3): 30 mg via INTRAVENOUS
  Filled 2017-03-11 (×2): qty 1

## 2017-03-11 MED ORDER — VASOPRESSIN 20 UNIT/ML IV SOLN
INTRAVENOUS | Status: DC | PRN
  Administered 2017-03-11: 1 [IU] via INTRAVENOUS

## 2017-03-11 MED ORDER — SODIUM CHLORIDE 0.9 % IJ SOLN
INTRAMUSCULAR | Status: AC
  Filled 2017-03-11: qty 50

## 2017-03-11 MED ORDER — PROMETHAZINE HCL 25 MG/ML IJ SOLN
6.2500 mg | Freq: Once | INTRAMUSCULAR | Status: AC
  Administered 2017-03-11: 6.25 mg via INTRAVENOUS

## 2017-03-11 MED ORDER — ACETAMINOPHEN 10 MG/ML IV SOLN
INTRAVENOUS | Status: DC | PRN
  Administered 2017-03-11: 1000 mg via INTRAVENOUS

## 2017-03-11 MED ORDER — VASOPRESSIN 20 UNIT/ML IV SOLN
INTRAVENOUS | Status: AC
  Filled 2017-03-11: qty 1

## 2017-03-11 MED ORDER — BUPIVACAINE HCL 0.5 % IJ SOLN
INTRAMUSCULAR | Status: DC | PRN
  Administered 2017-03-11: 5 mL

## 2017-03-11 MED ORDER — SODIUM CHLORIDE 0.9 % IV SOLN
INTRAVENOUS | Status: DC | PRN
  Administered 2017-03-11: 10 mL via INTRAMUSCULAR

## 2017-03-11 MED ORDER — MIDAZOLAM HCL 2 MG/2ML IJ SOLN
INTRAMUSCULAR | Status: DC | PRN
  Administered 2017-03-11: 2 mg via INTRAVENOUS

## 2017-03-11 MED ORDER — LACTATED RINGERS IV SOLN
INTRAVENOUS | Status: DC
  Administered 2017-03-11: 07:00:00 via INTRAVENOUS

## 2017-03-11 MED ORDER — BUPIVACAINE HCL (PF) 0.5 % IJ SOLN
INTRAMUSCULAR | Status: AC
  Filled 2017-03-11: qty 30

## 2017-03-11 MED ORDER — FENTANYL CITRATE (PF) 100 MCG/2ML IJ SOLN
INTRAMUSCULAR | Status: DC | PRN
  Administered 2017-03-11 (×2): 100 ug via INTRAVENOUS

## 2017-03-11 MED ORDER — KETOROLAC TROMETHAMINE 30 MG/ML IJ SOLN
INTRAMUSCULAR | Status: DC | PRN
  Administered 2017-03-11: 30 mg via INTRAVENOUS

## 2017-03-11 MED ORDER — ESTROGENS CONJUGATED 0.45 MG PO TABS
0.9000 mg | ORAL_TABLET | Freq: Every day | ORAL | Status: DC
  Administered 2017-03-12 – 2017-03-13 (×2): 0.9 mg via ORAL
  Filled 2017-03-11: qty 2
  Filled 2017-03-11: qty 1
  Filled 2017-03-11: qty 2

## 2017-03-11 MED ORDER — ROCURONIUM BROMIDE 100 MG/10ML IV SOLN
INTRAVENOUS | Status: DC | PRN
  Administered 2017-03-11: 10 mg via INTRAVENOUS
  Administered 2017-03-11: 30 mg via INTRAVENOUS
  Administered 2017-03-11: 10 mg via INTRAVENOUS

## 2017-03-11 MED ORDER — SUGAMMADEX SODIUM 200 MG/2ML IV SOLN
INTRAVENOUS | Status: DC | PRN
  Administered 2017-03-11: 160 mg via INTRAVENOUS

## 2017-03-11 MED ORDER — LACTATED RINGERS IV SOLN
INTRAVENOUS | Status: DC
  Administered 2017-03-11 (×2): via INTRAVENOUS

## 2017-03-11 MED ORDER — ACETAMINOPHEN NICU IV SYRINGE 10 MG/ML
INTRAVENOUS | Status: AC
  Filled 2017-03-11: qty 2

## 2017-03-11 MED ORDER — CEFAZOLIN SODIUM-DEXTROSE 2-4 GM/100ML-% IV SOLN
INTRAVENOUS | Status: AC
  Filled 2017-03-11: qty 100

## 2017-03-11 MED ORDER — IBUPROFEN 600 MG PO TABS
600.0000 mg | ORAL_TABLET | Freq: Four times a day (QID) | ORAL | Status: DC | PRN
  Administered 2017-03-12 – 2017-03-13 (×4): 600 mg via ORAL
  Filled 2017-03-11 (×4): qty 1

## 2017-03-11 MED ORDER — DEXTROSE IN LACTATED RINGERS 5 % IV SOLN
INTRAVENOUS | Status: DC
  Administered 2017-03-11 – 2017-03-12 (×3): via INTRAVENOUS

## 2017-03-11 SURGICAL SUPPLY — 45 items
ADHESIVE MASTISOL STRL (MISCELLANEOUS) ×3 IMPLANT
BAG URINE DRAINAGE (UROLOGICAL SUPPLIES) ×3 IMPLANT
BLADE SURG 15 STRL LF DISP TIS (BLADE) ×1 IMPLANT
BLADE SURG 15 STRL SS (BLADE) ×2
BLADE SURG SZ11 CARB STEEL (BLADE) ×3 IMPLANT
CATH FOLEY 2WAY  5CC 16FR (CATHETERS) ×2
CATH ROBINSON RED A/P 16FR (CATHETERS) ×3 IMPLANT
CATH URTH 16FR FL 2W BLN LF (CATHETERS) ×1 IMPLANT
CHLORAPREP W/TINT 26ML (MISCELLANEOUS) ×3 IMPLANT
DEFOGGER SCOPE WARMER CLEARIFY (MISCELLANEOUS) ×3 IMPLANT
DERMABOND ADVANCED (GAUZE/BANDAGES/DRESSINGS) ×2
DERMABOND ADVANCED .7 DNX12 (GAUZE/BANDAGES/DRESSINGS) ×1 IMPLANT
ELECT REM PT RETURN 9FT ADLT (ELECTROSURGICAL) ×3
ELECTRODE REM PT RTRN 9FT ADLT (ELECTROSURGICAL) ×1 IMPLANT
GLOVE BIOGEL PI ORTHO PRO 7.5 (GLOVE) ×8
GLOVE PI ORTHO PRO STRL 7.5 (GLOVE) ×4 IMPLANT
GLOVE SURG SYN 8.0 (GLOVE) ×18 IMPLANT
GOWN STRL REUS W/ TWL LRG LVL3 (GOWN DISPOSABLE) ×1 IMPLANT
GOWN STRL REUS W/ TWL XL LVL3 (GOWN DISPOSABLE) ×2 IMPLANT
GOWN STRL REUS W/TWL LRG LVL3 (GOWN DISPOSABLE) ×2
GOWN STRL REUS W/TWL XL LVL3 (GOWN DISPOSABLE) ×4
HANDLE YANKAUER SUCT BULB TIP (MISCELLANEOUS) ×3 IMPLANT
IRRIGATION STRYKERFLOW (MISCELLANEOUS) ×1 IMPLANT
IRRIGATOR STRYKERFLOW (MISCELLANEOUS) ×3
IV LACTATED RINGERS 1000ML (IV SOLUTION) ×6 IMPLANT
KIT PINK PAD W/HEAD ARE REST (MISCELLANEOUS) ×3
KIT PINK PAD W/HEAD ARM REST (MISCELLANEOUS) ×1 IMPLANT
KIT RM TURNOVER CYSTO AR (KITS) ×3 IMPLANT
LIGASURE LAP MARYLAND 5MM 37CM (ELECTROSURGICAL) IMPLANT
PACK BASIN MINOR ARMC (MISCELLANEOUS) ×3 IMPLANT
PACK GYN LAPAROSCOPIC (MISCELLANEOUS) ×3 IMPLANT
PAD OB MATERNITY 4.3X12.25 (PERSONAL CARE ITEMS) ×3 IMPLANT
SCISSORS METZENBAUM CVD 33 (INSTRUMENTS) ×3 IMPLANT
SLEEVE ENDOPATH XCEL 5M (ENDOMECHANICALS) ×6 IMPLANT
SOLUTION ELECTROLUBE (MISCELLANEOUS) ×3 IMPLANT
SPONGE LAP 18X18 5 PK (GAUZE/BANDAGES/DRESSINGS) ×3 IMPLANT
SPONGE XRAY 4X4 16PLY STRL (MISCELLANEOUS) ×3 IMPLANT
SUT VIC AB 0 CT1 27 (SUTURE) ×4
SUT VIC AB 0 CT1 27XCR 8 STRN (SUTURE) ×2 IMPLANT
SUT VIC AB 0 CT1 36 (SUTURE) ×3 IMPLANT
SUT VIC AB 4-0 FS2 27 (SUTURE) ×3 IMPLANT
SYR 10ML LL (SYRINGE) ×3 IMPLANT
TAPE TRANSPORE STRL 2 31045 (GAUZE/BANDAGES/DRESSINGS) ×3 IMPLANT
TROCAR XCEL NON-BLD 5MMX100MML (ENDOMECHANICALS) ×3 IMPLANT
TUBING INSUF HEATED (TUBING) ×3 IMPLANT

## 2017-03-11 NOTE — Anesthesia Procedure Notes (Signed)
Procedure Name: Intubation Date/Time: 03/11/2017 7:54 AM Performed by: Patience Musca., CRNA Pre-anesthesia Checklist: Patient identified, Patient being monitored, Timeout performed, Emergency Drugs available and Suction available Patient Re-evaluated:Patient Re-evaluated prior to induction Oxygen Delivery Method: Circle system utilized Preoxygenation: Pre-oxygenation with 100% oxygen Induction Type: IV induction Ventilation: Mask ventilation without difficulty Laryngoscope Size: 3 and McGraph Grade View: Grade I Tube type: Oral Tube size: 7.0 mm Number of attempts: 1 Airway Equipment and Method: Stylet Placement Confirmation: ETT inserted through vocal cords under direct vision,  positive ETCO2 and breath sounds checked- equal and bilateral Secured at: 21 cm Tube secured with: Tape Dental Injury: Teeth and Oropharynx as per pre-operative assessment

## 2017-03-11 NOTE — Anesthesia Preprocedure Evaluation (Signed)
Anesthesia Evaluation  Patient identified by MRN, date of birth, ID band Patient awake    Reviewed: Allergy & Precautions, H&P , NPO status , Patient's Chart, lab work & pertinent test results  History of Anesthesia Complications Negative for: history of anesthetic complications  Airway Mallampati: III  TM Distance: <3 FB Neck ROM: full    Dental  (+) Poor Dentition   Pulmonary neg pulmonary ROS, neg shortness of breath,           Cardiovascular Exercise Tolerance: Good (-) angina(-) Past MI negative cardio ROS       Neuro/Psych negative neurological ROS  negative psych ROS   GI/Hepatic negative GI ROS, Neg liver ROS, neg GERD  ,  Endo/Other  negative endocrine ROS  Renal/GU negative Renal ROS     Musculoskeletal   Abdominal   Peds  Hematology negative hematology ROS (+)   Anesthesia Other Findings History reviewed. No pertinent past medical history.  Past Surgical History: 2002 (left), 2009 (right): BUNIONECTOMY; Bilateral  BMI    Body Mass Index:  26.78 kg/m      Reproductive/Obstetrics negative OB ROS                             Anesthesia Physical  Anesthesia Plan  ASA: II  Anesthesia Plan: General   Post-op Pain Management:    Induction: Intravenous  PONV Risk Score and Plan: Propofol infusion  Airway Management Planned: Oral ETT  Additional Equipment:   Intra-op Plan:   Post-operative Plan: Extubation in OR  Informed Consent: I have reviewed the patients History and Physical, chart, labs and discussed the procedure including the risks, benefits and alternatives for the proposed anesthesia with the patient or authorized representative who has indicated his/her understanding and acceptance.   Dental Advisory Given  Plan Discussed with: Anesthesiologist, CRNA and Surgeon  Anesthesia Plan Comments: (Patient consented for risks of anesthesia including but not  limited to:  - adverse reactions to medications - risk of intubation if required - damage to teeth, lips or other oral mucosa - sore throat or hoarseness - Damage to heart, brain, lungs or loss of life  Patient voiced understanding.)        Anesthesia Quick Evaluation

## 2017-03-11 NOTE — Anesthesia Postprocedure Evaluation (Signed)
Anesthesia Post Note  Patient: Shelly Middleton  Procedure(s) Performed: LAPAROSCOPIC ASSISTED VAGINAL HYSTERECTOMY WITH BILATERAL OOPHERECTOMY (Bilateral )  Patient location during evaluation: PACU Anesthesia Type: General Level of consciousness: awake and alert and oriented Pain management: pain level controlled Vital Signs Assessment: post-procedure vital signs reviewed and stable Respiratory status: spontaneous breathing Cardiovascular status: blood pressure returned to baseline Anesthetic complications: no     Last Vitals:  Vitals:   03/11/17 1215 03/11/17 1300  BP:  125/75  Pulse:  65  Resp: 16 16  Temp:  36.6 C  SpO2:  99%    Last Pain:  Vitals:   03/11/17 1310  TempSrc:   PainSc: Asleep                 Brizeida Mcmurry

## 2017-03-11 NOTE — Transfer of Care (Signed)
Immediate Anesthesia Transfer of Care Note  Patient: Shelly Middleton  Procedure(s) Performed: LAPAROSCOPIC ASSISTED VAGINAL HYSTERECTOMY WITH BILATERAL OOPHERECTOMY (Bilateral )  Patient Location: PACU  Anesthesia Type:General  Level of Consciousness: sedated and patient cooperative  Airway & Oxygen Therapy: Patient Spontanous Breathing  Post-op Assessment: Report given to RN and Post -op Vital signs reviewed and stable  Post vital signs: Reviewed and stable  Last Vitals:  Vitals:   03/11/17 1200 03/11/17 1215  BP: 113/60   Pulse: 68   Resp: 18 16  Temp: 36.8 C   SpO2: 98%     Last Pain:  Vitals:   03/11/17 1200  TempSrc: Axillary  PainSc:          Complications: No apparent anesthesia complications

## 2017-03-11 NOTE — Anesthesia Post-op Follow-up Note (Signed)
Anesthesia QCDR form completed.        

## 2017-03-11 NOTE — Op Note (Addendum)
OPERATIVE NOTE 03/11/2017 10:17 AM  PRE-OPERATIVE DIAGNOSIS:  1) PELVIC PAIN, FIBROIDS INTRAMURAL  POST-OPERATIVE DIAGNOSIS:  1) Same with... 2) Bilateral endometriomas 3) stage IV endometriosis with obliterated cul-de-sac and extensive adnexal and pelvic adhesions  OPERATION: Procedure(s) (LRB): LAPAROSCOPIC ASSISTED VAGINAL HYSTERECTOMY WITH BILATERAL OOPHERECTOMY (Bilateral)   Lysis of pelvic adhesions  SURGEON(S): Surgeon(s) and Role:    Harlin Heys, MD - Primary    * Defrancesco, Alanda Slim, MD - Assisting   ANESTHESIA: General  ESTIMATED BLOOD LOSS: 800 mL  OPERATIVE FINDINGS: Obliterated cul-de-sac, stage IV endometriosis, bilateral endometriomas, adnexal adhesions  SPECIMEN: * No specimens in log *  COMPLICATIONS: None  DRAINS: Foley to gravity  DISPOSITION: Stable to recovery room  DESCRIPTION OF PROCEDURE:      The patient was prepped and draped in the dorsolithotomy position and placed under general anesthesia. The bladder was emptied. The cervix was grasped with a multi-toothed tenaculum and a uterine manipulator was placed within the cervical os respecting the position and curvature of the uterus. After changing gloves we proceeded abdominally. A small infraumbilical incision was made and a 5 mm trocar port was placed within the abdominopelvic cavity. The opening pressure was less than 7 mmHg.  Approximately 3 and 1/2 L of carbon dioxide gas was instilled within the abdominal pelvic cavity. The laparoscope was placed and the pelvis and abdomen were carefully inspected. In the usual manner, under direct visualization right and left lower quadrant ports of 5 mm size were placed. We immediately encountered bilateral endometriomas complete obliteration of the cul-de-sac and significant adhesions to both adnexa.  We spent the next approximately 45 minutes bluntly and sharply dissecting.  The ovaries were elevated from their adhesions.  The cul-de-sac was  identified and carefully dissected to the point where a cul-de-sac existed.  Copious irrigation was used.  Endometriotic implants were ablated.  During neck dissection a bleeder was noted near the right adnexa toward the back of the uterus.  Once identified this was coagulated and hemostasis was much improved. The fallopian tubes were elevated and the mesenteric side systematically coagulated and divided allowing the tube to be removed at the time of uterine removal. The round ligaments were coagulated and divided and a bladder flap was created. The upper aspect of the broad ligament was clamped coagulated and divided. The uterine arteries were skeletonized, triply coagulated and divided. Careful inspection of all pedicles and the remainder of the pelvis was performed. Hemostasis was noted. The infundibulopelvic ligaments were carefully identified.  The ligaments were triply coagulated and divided. Hemostasis of the pedicles was noted. We then proceeded vaginally. A weighted speculum was placed posteriorly. A multi-toothed tenaculum was used to grasp the cervix and the cervix was injected in a circumferential manner with a dilute Pitressin solution. An incision was made around the cervix and the vaginal mucosa was dissected off of the cervix. The posterior cul-de-sac was identified and entered and the weighted speculum was placed within this. The anterior cul-de-sac was identified and entered and a retractor was placed and used to retract the bladder anteriorly keeping it out of the operative field. The uterosacral ligaments were clamped divided and suture ligated. The cardinal ligaments were clamped divided and suture ligated. The small remaining pedicle was clamped divided and suture ligated bilaterally allowing delivery of the specimen. Angle sutures were placed in the manner. A culdoplasty was performed. The peritoneum was identified anteriorly and then incorporating the left upper pedicle left lower  pedicle right lower pedicle right upper pedicle and anterior peritoneum a pursestring suture was placed exteriorizing all pedicles. Hemostasis of all pedicles was noted at this time. The vaginal mucosa was then closed with a running suture of Vicryl. We then proceeded back abdominally and inspected the pelvis through the laparoscope.  Hemostasis was noted.  Additional irrigation was performed and any endometriotic implants were coagulated.  The lower quadrant ports were removed, hemostasis of the port sites was noted, and the incisions were closed in subcuticular manner. The laparoscope and trocar sleeve were removed from the infraumbilical incision, hemostasis was noted, and the incision was closed in a subcuticular manner. A long-acting anesthetic was employed in the skin incisions.  The patient went to recovery room in stable condition. Clear urine was noted in the Foley at the conclusion of the procedure.  Finis Bud, M.D. 03/11/2017 10:17 AM

## 2017-03-11 NOTE — Interval H&P Note (Signed)
History and Physical Interval Note:  03/11/2017 7:46 AM  Shelly Middleton  has presented today for surgery, with the diagnosis of PELVIC PAIN, FIBROIDS INTRAMURAL  The various methods of treatment have been discussed with the patient and family. After consideration of risks, benefits and other options for treatment, the patient has consented to  Procedure(s): LAPAROSCOPIC ASSISTED VAGINAL HYSTERECTOMY WITH BILATERAL OOPHERECTOMY (Bilateral) as a surgical intervention .  The patient's history has been reviewed, patient examined, no change in status, stable for surgery.  I have reviewed the patient's chart and labs.  Questions were answered to the patient's satisfaction.     Jeannie Fend

## 2017-03-11 NOTE — H&P (Signed)
PRE-OPERATIVE HISTORY AND PHYSICAL EXAM  PCP:  Wayland Denis, PA-C Subjective:   HPI:  Shelly Middleton is a 42 y.o. [id number].  Patient's last menstrual period was 02/05/2017 (exact date).  She presents today for a pre-op discussion and PE.  She has the following symptoms: She has uterine fibroids and small ovarian cysts.  She has been experiencing pelvic pain for many years.  It has become worse lately.  She has tried OCPs for cycle and pain control and these have not helped.  She is tired of having pain and bleeding.  Review of Systems:   Constitutional: Denied constitutional symptoms, night sweats, recent illness, fatigue, fever, insomnia and weight loss.  Eyes: Denied eye symptoms, eye pain, photophobia, vision change and visual disturbance.  Ears/Nose/Throat/Neck: Denied ear, nose, throat or neck symptoms, hearing loss, nasal discharge, sinus congestion and sore throat.  Cardiovascular: Denied cardiovascular symptoms, arrhythmia, chest pain/pressure, edema, exercise intolerance, orthopnea and palpitations.  Respiratory: Denied pulmonary symptoms, asthma, pleuritic pain, productive sputum, cough, dyspnea and wheezing.  Gastrointestinal: Denied, gastro-esophageal reflux, melena, nausea and vomiting.  Genitourinary: See HPI for additional information.  Musculoskeletal: Denied musculoskeletal symptoms, stiffness, swelling, muscle weakness and myalgia.  Dermatologic: Denied dermatology symptoms, rash and scar.  Neurologic: Denied neurology symptoms, dizziness, headache, neck pain and syncope.  Psychiatric: Denied psychiatric symptoms, anxiety and depression.  Endocrine: Denied endocrine symptoms including hot flashes and night sweats.                   OB History  Gravida Para Term Preterm AB Living  4 4 4     4   SAB TAB Ectopic Multiple Live Births          4    # Outcome Date GA Lbr Len/2nd Weight Sex Delivery Anes PTL Lv  4 Term  2007    F Vag-Spont  N LIV  3 Term 2004    F Vag-Spont  N LIV  2 Term 2002    F Vag-Spont  N LIV  1 Term 2000    F Vag-Spont  N LIV          Past Medical History:  Diagnosis Date  . Endometriosis          Past Surgical History:  Procedure Laterality Date  . BUNIONECTOMY Bilateral 2002 (left), 2009 (right)  . COLONOSCOPY WITH PROPOFOL N/A 11/02/2016   Procedure: COLONOSCOPY WITH PROPOFOL;  Surgeon: Lucilla Lame, MD;  Location: [facility] ENDOSCOPY;  Service: Endoscopy;  Laterality: N/A;      SOCIAL HISTORY: Social History      Tobacco Use  Smoking Status Never Smoker  Smokeless Tobacco Never Used   Social History       Substance and Sexual Activity   Alcohol Use No    Social History      Substance and Sexual Activity  Drug Use No         Family History  Problem Relation Age of Onset  . Hypertension Mother   . Endometriosis Mother   . Hypertension Father   . CAD Father   . Breast cancer Maternal Grandmother 78       and possibly 1 or 2 of grandmother's sisters    ALLERGIES:  Tape  MEDS:  Current Outpatient Medications on File Prior to Visit  Medication Sig Dispense Refill  . desogestrel-ethinyl estradiol (VIORELE) 0.15-0.02/0.01 MG (21/5) tablet Take by mouth.    Marland Kitchen FLUoxetine (PROZAC) 20 MG capsule   3  . triamcinolone cream (KENALOG) 0.1 % APP EXT AA BID  0   No current facility-administered medications on file prior to visit.                               Physical examination BP 105/71   Pulse 89   Ht 5\' 4"  (1.626 m)   Wt 146 lb 2 oz (66.3 kg)   LMP 02/05/2017 (Exact Date)   BMI 25.08 kg/m   General NAD, Conversant  HEENT Atraumatic; Op clear with mmm.  Normo-cephalic. Pupils reactive. Anicteric sclerae  Thyroid/Neck Smooth without nodularity or enlargement. Normal ROM.  Neck Supple.  Skin No rashes, lesions or ulceration. Normal palpated skin turgor. No nodularity.    Breasts: No masses or discharge.  Symmetric.  No axillary adenopathy.  Lungs: Clear to auscultation.No rales or wheezes. Normal Respiratory effort, no retractions.  Heart: NSR.  No murmurs or rubs appreciated. No periferal edema  Abdomen: Soft.  Non-tender.  No masses.  No HSM. No hernia  Extremities: Moves all appropriately.  Normal ROM for age. No lymphadenopathy.  Neuro: Oriented to PPT.  Normal mood. Normal affect.             Pelvic:   Vulva: Normal appearance.  No lesions.   Vagina: No lesions or abnormalities noted.   Support: Normal pelvic support.   Urethra No masses tenderness or scarring.   Meatus Normal size without lesions or prolapse.   Cervix: Normal ectropion.  No lesions.   Anus: Normal exam.  No lesions.   Perineum: Normal exam.  No lesions.         Bimanual   Uterus:  Top normal size non-tender.  Mobile.  AV.    Adnexae: No masses.  Non-tender to palpation.    Cul-de-sac: Negative for abnormality.     No change in her H&P from the office. Assessment:   [id number]     Patient Active Problem List   Diagnosis Date Noted  . Abnormal CT scan, colon   . Abnormal abdominal CT scan   . Abdominal pain 10/31/2016  . Abdominal pain, RLQ     1. Preop examination   2. Pelvic pain in female   3. Fibroids, intramural      Plan:   Orders:      1.  laparoscopic assisted vaginal hysterectomy  BSO 2.  She has re-affirmed her desire for BSO today.           Electronically signed by Harlin Heys, MD at 02/06/2017 4:23 PM     Office Visit on 02/06/2017        Routing History        Detailed Report

## 2017-03-12 ENCOUNTER — Encounter: Payer: Self-pay | Admitting: Obstetrics and Gynecology

## 2017-03-12 DIAGNOSIS — D251 Intramural leiomyoma of uterus: Secondary | ICD-10-CM | POA: Diagnosis not present

## 2017-03-12 LAB — SURGICAL PATHOLOGY

## 2017-03-12 MED ORDER — OXYCODONE-ACETAMINOPHEN 5-325 MG PO TABS
1.0000 | ORAL_TABLET | ORAL | Status: DC | PRN
  Administered 2017-03-12: 1 via ORAL
  Administered 2017-03-12 – 2017-03-13 (×3): 2 via ORAL
  Administered 2017-03-13: 1 via ORAL
  Filled 2017-03-12: qty 1
  Filled 2017-03-12: qty 2
  Filled 2017-03-12: qty 1
  Filled 2017-03-12: qty 2
  Filled 2017-03-12 (×2): qty 1
  Filled 2017-03-12: qty 2

## 2017-03-12 NOTE — Plan of Care (Signed)
Vs stable; tolerating liquids; will have foley removed prior to next shift; per pt, "my doctor said I would probably stay until Wednesday"

## 2017-03-12 NOTE — Progress Notes (Signed)
Patient ID: Shelly Middleton, female   DOB: 1976/01/31, 42 y.o.   MRN: [id number]     POST-OP NOTE - DAY # 1    Subjective:   The patient does not have complaints.  She is ambulating well. She is taking PO well. Her pain is well controlled with her current medications. She is urinating without difficulty and is passing flatus.   Objective:  BP 116/71 (BP Location: Left Arm)   Pulse 86   Temp 98.8 F (37.1 C) (Oral)   Resp 18   Ht 5\' 4"  (1.626 m)   Wt 149 lb (67.6 kg)   LMP 03/04/2017 (Exact Date)   SpO2 98%   BMI 25.58 kg/m     Abdomen:                          Abdomen soft and nontender without distention, masses , no wound infection noted.      clean, dry, no drainage    Assessment:   Doing well.  Normal progress as expected.   We discussed her surgery in detail.  Endometriomas, endometriosis and severe pelvic adhesions discussed.  All her questions were answered.   Plan:        Encourage ambulation Advance to PO medication Discontinue IV fluids Discharge tomorrow   Finis Bud, M.D. 03/12/2017 8:42 AM

## 2017-03-13 DIAGNOSIS — D251 Intramural leiomyoma of uterus: Secondary | ICD-10-CM | POA: Diagnosis not present

## 2017-03-13 MED ORDER — ESTROGENS CONJUGATED 0.9 MG PO TABS: 0.9000 mg | ORAL_TABLET | Freq: Every day | ORAL | 0 refills | Status: DC

## 2017-03-13 MED ORDER — OXYCODONE-ACETAMINOPHEN 5-325 MG PO TABS: 1.0000 | ORAL_TABLET | ORAL | 0 refills | Status: DC | PRN

## 2017-03-13 NOTE — Progress Notes (Signed)
Patient discharged home with husband Discharge instructions and prescriptions given and reviewed with patient. Patient verbalized understanding. Escorted out by auxillary.  

## 2017-03-13 NOTE — Plan of Care (Signed)
Vs stable; up ad lib; regular diet; taking motrin and percocet for pain management; incisions are WNL; probable discharge today

## 2017-03-13 NOTE — Discharge Summary (Signed)
    Discharge Summary  Admit date: 03/11/2017  Discharge Date and Time:03/13/2017  12:32 PM  Discharge to:  Home  Admission Diagnosis: Pelvic pain, dysmenorrhea, uterine fibroids                    Discharge  Diagnoses: Same with stage IV endometriosis including bilateral endometriomas and extensive pelvic adhesive disease  OR Procedures:   Procedure(s): LAPAROSCOPIC ASSISTED VAGINAL HYSTERECTOMY WITH BILATERAL OOPHERECTOMY with extensive lysis of adhesions and ablation of endometriotic implants.  Date -------------------                              Discharge Day Progress Note:   Subjective:   The patient does not have complaints.  She is ambulating well. She is taking PO well. Her pain is well controlled with her current medications. She is urinating without difficulty and is passing flatus.   Objective:  BP 114/68 (BP Location: Right Arm)   Pulse 80   Temp 98.4 F (36.9 C) (Oral)   Resp 18   Ht 5\' 4"  (1.626 m)   Wt 149 lb (67.6 kg)   LMP 03/04/2017 (Exact Date)   SpO2 99%   BMI 25.58 kg/m     Abdomen:                         clean, dry, no drainage    Assessment:   Doing well.  Normal progress as expected.     Plan:        Discharge home.                       Medications as directed.  Hospital Course:  No notes on file   Condition at Discharge:  good Discharge Medications:  Allergies as of 03/13/2017      Reactions   Tape Rash   Reaction only happens on feet, the rest of the body is ok      Medication List    STOP taking these medications   metroNIDAZOLE 500 MG tablet Commonly known as:  FLAGYL     TAKE these medications   estrogens (conjugated) 0.9 MG tablet Commonly known as:  PREMARIN Take 1 tablet (0.9 mg total) by mouth daily.   FLUoxetine 20 MG capsule Commonly known as:  PROZAC Take 20 mg by mouth every morning.   oxyCODONE-acetaminophen 5-325 MG tablet Commonly known as:  PERCOCET/ROXICET Take 1-2 tablets by mouth every 4 (four)  hours as needed for moderate pain.        Follow Up:   Follow-up Information    Harlin Heys, MD Follow up in 1 week(s).   Specialty:  Obstetrics and Gynecology Contact information: [address] [place] [address] [phone]           Finis Bud, M.D. 03/13/2017 12:32 PM

## 2017-03-14 ENCOUNTER — Other Ambulatory Visit: Payer: Self-pay

## 2017-03-14 ENCOUNTER — Emergency Department: Payer: BC Managed Care – PPO

## 2017-03-14 ENCOUNTER — Emergency Department
Admission: EM | Admit: 2017-03-14 | Discharge: 2017-03-15 | Disposition: A | Discharge status: Home / Self Care | Payer: BC Managed Care – PPO | Attending: Emergency Medicine | Admitting: Emergency Medicine

## 2017-03-14 ENCOUNTER — Encounter: Payer: Self-pay | Admitting: Emergency Medicine

## 2017-03-14 DIAGNOSIS — R1030 Lower abdominal pain, unspecified: Secondary | ICD-10-CM | POA: Diagnosis not present

## 2017-03-14 DIAGNOSIS — Z79899 Other long term (current) drug therapy: Secondary | ICD-10-CM | POA: Insufficient documentation

## 2017-03-14 DIAGNOSIS — K5900 Constipation, unspecified: Secondary | ICD-10-CM | POA: Diagnosis not present

## 2017-03-14 DIAGNOSIS — R509 Fever, unspecified: Secondary | ICD-10-CM | POA: Diagnosis present

## 2017-03-14 MED ORDER — IOPAMIDOL (ISOVUE-300) INJECTION 61%
15.0000 mL | INTRAVENOUS | Status: AC
Start: 2017-03-15 — End: 2017-03-15
  Administered 2017-03-14 – 2017-03-15 (×2): 15 mL via ORAL

## 2017-03-14 MED ORDER — PIPERACILLIN-TAZOBACTAM 3.375 G IVPB 30 MIN
3.3750 g | Freq: Once | INTRAVENOUS | Status: AC
  Administered 2017-03-14: 3.375 g via INTRAVENOUS
  Filled 2017-03-14: qty 50

## 2017-03-14 MED ORDER — SODIUM CHLORIDE 0.9 % IV BOLUS (SEPSIS)
1000.0000 mL | Freq: Once | INTRAVENOUS | Status: AC
  Administered 2017-03-14: 1000 mL via INTRAVENOUS

## 2017-03-14 NOTE — ED Triage Notes (Signed)
Patient ambulatory to triage with steady gait, without difficulty or distress noted; pt reports had vag & laproscopic hysterectomy Monday; now with fever (100.7) and incisional pain; at 945pm, took percocet 1 tab

## 2017-03-14 NOTE — ED Provider Notes (Addendum)
[facility] Emergency Department Provider Note  ____________________________________________   First MD Initiated Contact with Patient 03/14/17 2313     (approximate)  I have reviewed the triage vital signs and the nursing notes.   HISTORY  Chief Complaint No chief complaint on file.    HPI Shelly Middleton is a 42 y.o. female who had total hysterectomy 4-5 days ago who presents by private vehicle for evaluation of fever to 100.7, general malaise, and increasing abdominal pain at the site of the incisions in her lower abdomen.  She states that she has been generally doing well since the surgery but for the last 24 or so hours she has been feeling generally ill without any specific symptoms.  Gradually her lower abdomen has started to hurt worse over the course of the day and then tonight she felt subjective fever and measured her temperature at home which was 100.7.  She called the nurse line for [facility] (her surgeon was Dr. Amalia Hailey) and told him the symptoms and they told her to go to the emergency department.  Her fever onset was acute and the general malaise and increasing abdominal pain has been gradual over the course of the day.  She denies chest pain, shortness of breath, cough, nausea, and vomiting.  She has been constipated since at least the day of the surgery if not before.  She is not taking any stool softeners.  She denies dysuria.  She has been having some minimal vaginal bleeding which she was told to expect.   Past Medical History:  Diagnosis Date  . Endometriosis     Patient Active Problem List   Diagnosis Date Noted  . Post-operative state 03/11/2017  . Abnormal CT scan, colon   . Abnormal abdominal CT scan   . Abdominal pain 10/31/2016  . Abdominal pain, RLQ     Past Surgical History:  Procedure Laterality Date  . BUNIONECTOMY Bilateral 2002 (left), 2009 (right)  . COLONOSCOPY WITH PROPOFOL N/A 11/02/2016   Procedure:  COLONOSCOPY WITH PROPOFOL;  Surgeon: Lucilla Lame, MD;  Location: [facility] ENDOSCOPY;  Service: Endoscopy;  Laterality: N/A;  . LAPAROSCOPIC VAGINAL HYSTERECTOMY WITH SALPINGO OOPHORECTOMY Bilateral 03/11/2017   Procedure: LAPAROSCOPIC ASSISTED VAGINAL HYSTERECTOMY WITH BILATERAL OOPHERECTOMY;  Surgeon: Harlin Heys, MD;  Location: [facility] ORS;  Service: Gynecology;  Laterality: Bilateral;    Prior to Admission medications   Medication Sig Start Date End Date Taking? Authorizing Provider  estrogens, conjugated, (PREMARIN) 0.9 MG tablet Take 1 tablet (0.9 mg total) by mouth daily. 03/13/17 05/02/17 Yes Harlin Heys, MD  FLUoxetine (PROZAC) 20 MG capsule Take 20 mg by mouth every morning.  01/07/17  Yes [provider]  metroNIDAZOLE (FLAGYL) 500 MG tablet Take 1 tablet by mouth 2 (two) times daily. 03/06/17  Yes [provider]  oxyCODONE-acetaminophen (PERCOCET/ROXICET) 5-325 MG tablet Take 1-2 tablets by mouth every 4 (four) hours as needed for moderate pain. 03/13/17  Yes Harlin Heys, MD    Allergies Tape  Family History  Problem Relation Age of Onset  . Hypertension Mother   . Endometriosis Mother   . Hypertension Father   . CAD Father   . Breast cancer Maternal Grandmother 78       and possibly 1 or 2 of grandmother's sisters    Social History Social History   Tobacco Use  . Smoking status: Never Smoker  . Smokeless tobacco: Never Used  Substance Use Topics  . Alcohol use: No  .  Drug use: No    Review of Systems Constitutional: T-max 100.7 with general malaise Eyes: No ocular discharge or vision changes ENT: No sore throat. Cardiovascular: Denies chest pain. Respiratory: Denies shortness of breath.  Denies cough Gastrointestinal: Recently increased lower abdominal pain around her laparoscopic incision sites.  No nausea nor vomiting.  Constipated for 5+ days (since at least the day of the surgery) Genitourinary: Negative for dysuria.  Some bloody  vaginal discharge. Musculoskeletal: Negative for neck pain.  Negative for back pain. Integumentary: Negative for rash. Neurological: Negative for headaches, focal weakness or numbness. Psychiatric:No complaints  ____________________________________________   PHYSICAL EXAM:  VITAL SIGNS: ED Triage Vitals  Enc Vitals Group     BP 03/14/17 2304 (!) 141/69     Pulse Rate 03/14/17 2304 (!) 107     Resp 03/14/17 2304 20     Temp 03/14/17 2304 97.9 F (36.6 C)     Temp Source 03/14/17 2304 Oral     SpO2 03/14/17 2304 98 %     Weight 03/14/17 2305 67.6 kg (149 lb)     Height 03/14/17 2305 1.626 m (5\' 4" )     Head Circumference --      Peak Flow --      Pain Score 03/14/17 2304 3     Pain Loc --      Pain Edu? --      Excl. in [place]? --     Constitutional: Alert and oriented. No acute distress but appears somewhat uncomfortable Eyes: Conjunctivae are normal.  Head: Atraumatic. Nose: No congestion/rhinnorhea. Mouth/Throat: Mucous membranes are moist. Neck: No stridor.  No meningeal signs.   Cardiovascular: Mild tachycardia, regular rhythm. Good peripheral circulation. Grossly normal heart sounds. Respiratory: Normal respiratory effort.  No retractions. Lungs CTAB. Gastrointestinal: Soft with well-appearing surgical incisions and no dehiscence, still covered with Peri-Strips.  Old, diffuse abdominal tenderness throughout, appropriate for postoperative state, but increased abdominal tenderness in the right lower quadrant near 1 of the incision sites. No palpable areas of fluctuance nor induration to suggest a superficial abscess or phlegmon. Musculoskeletal: No lower extremity tenderness nor edema. No gross deformities of extremities. Neurologic:  Normal speech and language. No gross focal neurologic deficits are appreciated.  Skin:  Skin is warm, pale, dry and intact. No rash noted. Psychiatric: Mood and affect are normal. Speech and behavior are  normal.  ____________________________________________   LABS (all labs ordered are listed, but only abnormal results are displayed)  Labs Reviewed  COMPREHENSIVE METABOLIC PANEL - Abnormal; Notable for the following components:      Result Value   Potassium 3.3 (*)    Glucose, Bld 110 (*)    Total Protein 6.4 (*)    ALT 13 (*)    Alkaline Phosphatase 35 (*)    All other components within normal limits  CBC WITH DIFFERENTIAL/PLATELET - Abnormal; Notable for the following components:   RBC 3.30 (*)    Hemoglobin 10.4 (*)    HCT 29.9 (*)    All other components within normal limits  URINALYSIS, ROUTINE W REFLEX MICROSCOPIC - Abnormal; Notable for the following components:   Color, Urine YELLOW (*)    APPearance CLEAR (*)    Hgb urine dipstick SMALL (*)    Squamous Epithelial / LPF 0-5 (*)    All other components within normal limits  LACTIC ACID, PLASMA  LIPASE, BLOOD  PROCALCITONIN    ____________________________________________  EKG  None - EKG not ordered by ED physician ____________________________________________  RADIOLOGY I,  Hinda Kehr, personally viewed and evaluated these images (plain radiographs) as part of my medical decision making, as well as reviewing the written report by the radiologist.  No results found.  ____________________________________________   PROCEDURES  Critical Care performed: No   Procedure(s) performed:   Procedures   ____________________________________________   INITIAL IMPRESSION / ASSESSMENT AND PLAN / ED COURSE  As part of my medical decision making, I reviewed the following data within the [place] notes reviewed and incorporated, Labs reviewed , Old chart reviewed and A phone consult was requested and obtained from this/these consultant(s) OB/GYN (Dr. Amalia Hailey)    Differential diagnosis includes, but is not limited to, evolving postoperative intra-abdominal abscess, pneumonia in the setting  of recent intubation, UTI, viral infection, etc.  that ruling out intra-abdominal abscess is the most important issue.  I will obtain standard labs including a lactic acid and pro-calcitonin; the patient is afebrile but she took a Percocet after measuring her temperature at home and she is mildly tachycardic.  IV fluids and Zosyn for broad-spectrum intra-abdominal infection coverage.  I will obtain a CT scan of the abdomen and pelvis with oral and IV contrast both for better imaging (especially given the slight possibility of bowel injury ) and to help with her constipation which is a potentially positive side effect from the oral contrast.  Patient declines analgesia and antiemetics at this time.  Clinical Course as of Mar 15 309  Fri Mar 15, 2017  0013 No evidence of pneumonia. DG Chest 2 View [CF]  0036 Lactic Acid, Venous: 1.3 [CF]  0036 WBC: 8.3 [CF]  0130 Procalcitonin: 0.13 [CF]  0130 Labs thus far very reassuring.  Patient should get CT scan within the next 30 minutes.  Requesting pain medication, providing morphine 4 mg IV and Zofran 4 mg IV since she just drank the PO contrast and I do not want her to vomit.  [CF]  6606 Reassuring CT scan with no evidence of acute infection or abscess.  Some pelvic free fluid.  I will discuss by phone with Dr. Amalia Hailey and then update the patient. CT Abdomen Pelvis W Contrast [CF]  0303 I discussed the case by phone with Dr. Amalia Hailey.  He understands and agrees with plan for outpatient follow-up.  He requested that I not start her on antibiotics as an outpatient because we do not have a specific source and he wants to see if it develops into a more consistent and/or higher fever.  I updated the patient with this plan and she is comfortable with the plan for discharge and outpatient follow-up.  Her vital signs are stable and her tachycardia has improved.  I gave my usual and customary return precautions.   [CF]    Clinical Course User Index [CF] Hinda Kehr, MD        ____________________________________________  FINAL CLINICAL IMPRESSION(S) / ED DIAGNOSES  Final diagnoses:  Fever, unspecified fever cause  Lower abdominal pain  Constipation, unspecified constipation type     MEDICATIONS GIVEN DURING THIS VISIT:  Medications    sodium chloride 0.9 % bolus 1,000 mL (1,000 mLs Intravenous New Bag/Given 03/14/17 2352)  piperacillin-tazobactam (ZOSYN) IVPB 3.375 g (0 g Intravenous Stopped 03/15/17 0036)  iopamidol (ISOVUE-300) 61 % injection 15 mL (15 mLs Oral Contrast Given 03/15/17 0100)  morphine 4 MG/ML injection 4 mg (4 mg Intravenous Given 03/15/17 0145)  ondansetron (ZOFRAN) injection 4 mg (4 mg Intravenous Given 03/15/17 0145)  iopamidol (ISOVUE-370) 76 % injection 100  mL (100 mLs Intravenous Contrast Given 03/15/17 0150)     ED Discharge Orders    None       Note:  This document was prepared using Dragon voice recognition software and may include unintentional dictation errors.    Hinda Kehr, MD 03/15/17 Anola Gurney    Hinda Kehr, MD 03/15/17 [phone]

## 2017-03-15 ENCOUNTER — Emergency Department: Payer: BC Managed Care – PPO

## 2017-03-15 LAB — CBC WITH DIFFERENTIAL/PLATELET
BASOS PCT: 1 %
Basophils Absolute: 0.1 10*3/uL (ref 0–0.1)
EOS ABS: 0.3 10*3/uL (ref 0–0.7)
Eosinophils Relative: 4 %
HCT: 29.9 % — ABNORMAL LOW (ref 35.0–47.0)
Hemoglobin: 10.4 g/dL — ABNORMAL LOW (ref 12.0–16.0)
LYMPHS ABS: 1 10*3/uL (ref 1.0–3.6)
Lymphocytes Relative: 12 %
MCH: 31.5 pg (ref 26.0–34.0)
MCHC: 34.8 g/dL (ref 32.0–36.0)
MCV: 90.6 fL (ref 80.0–100.0)
MONO ABS: 0.7 10*3/uL (ref 0.2–0.9)
MONOS PCT: 8 %
NEUTROS PCT: 75 %
Neutro Abs: 6.2 10*3/uL (ref 1.4–6.5)
PLATELETS: 319 10*3/uL (ref 150–440)
RBC: 3.3 MIL/uL — ABNORMAL LOW (ref 3.80–5.20)
RDW: 12.8 % (ref 11.5–14.5)
WBC: 8.3 10*3/uL (ref 3.6–11.0)

## 2017-03-15 LAB — COMPREHENSIVE METABOLIC PANEL
ALBUMIN: 3.7 g/dL (ref 3.5–5.0)
ALT: 13 U/L — AB (ref 14–54)
ANION GAP: 11 (ref 5–15)
AST: 22 U/L (ref 15–41)
Alkaline Phosphatase: 35 U/L — ABNORMAL LOW (ref 38–126)
BILIRUBIN TOTAL: 1 mg/dL (ref 0.3–1.2)
BUN: 10 mg/dL (ref 6–20)
CALCIUM: 9.4 mg/dL (ref 8.9–10.3)
CO2: 23 mmol/L (ref 22–32)
CREATININE: 0.6 mg/dL (ref 0.44–1.00)
Chloride: 106 mmol/L (ref 101–111)
GFR calc Af Amer: >60 mL/min (ref >60)
GFR calc non Af Amer: >60 mL/min (ref >60)
GLUCOSE: 110 mg/dL — AB (ref 65–99)
Potassium: 3.3 mmol/L — ABNORMAL LOW (ref 3.5–5.1)
Sodium: 140 mmol/L (ref 135–145)
TOTAL PROTEIN: 6.4 g/dL — AB (ref 6.5–8.1)

## 2017-03-15 LAB — URINALYSIS, ROUTINE W REFLEX MICROSCOPIC
Bacteria, UA: NONE SEEN
Bilirubin Urine: NEGATIVE
GLUCOSE, UA: NEGATIVE mg/dL
Ketones, ur: NEGATIVE mg/dL
Leukocytes, UA: NEGATIVE
Nitrite: NEGATIVE
PROTEIN: NEGATIVE mg/dL
Specific Gravity, Urine: 1.015 (ref 1.005–1.030)
pH: 7 (ref 5.0–8.0)

## 2017-03-15 LAB — LIPASE, BLOOD: Lipase: 27 U/L (ref 11–51)

## 2017-03-15 LAB — LACTIC ACID, PLASMA: Lactic Acid, Venous: 1.3 mmol/L (ref 0.5–1.9)

## 2017-03-15 LAB — PROCALCITONIN: PROCALCITONIN: 0.13 ng/mL

## 2017-03-15 MED ORDER — ONDANSETRON HCL 4 MG/2ML IJ SOLN
4.0000 mg | Freq: Once | INTRAMUSCULAR | Status: AC
  Administered 2017-03-15: 4 mg via INTRAVENOUS

## 2017-03-15 MED ORDER — ONDANSETRON HCL 4 MG/2ML IJ SOLN
INTRAMUSCULAR | Status: AC
  Administered 2017-03-15: 4 mg via INTRAVENOUS
  Filled 2017-03-15: qty 2

## 2017-03-15 MED ORDER — IOPAMIDOL (ISOVUE-370) INJECTION 76%
100.0000 mL | Freq: Once | INTRAVENOUS | Status: AC | PRN
  Administered 2017-03-15: 100 mL via INTRAVENOUS

## 2017-03-15 MED ORDER — MORPHINE SULFATE (PF) 4 MG/ML IV SOLN
INTRAVENOUS | Status: AC
  Administered 2017-03-15: 4 mg via INTRAVENOUS
  Filled 2017-03-15: qty 1

## 2017-03-15 MED ORDER — MORPHINE SULFATE (PF) 4 MG/ML IV SOLN
4.0000 mg | Freq: Once | INTRAVENOUS | Status: AC
  Administered 2017-03-15: 4 mg via INTRAVENOUS

## 2017-03-15 MED ORDER — PIPERACILLIN-TAZOBACTAM 3.375 G IVPB: 3.3750 g | Freq: Three times a day (TID) | INTRAVENOUS | Status: DC

## 2017-03-15 NOTE — Discharge Instructions (Addendum)
You have been seen in the Emergency Department (ED) for abdominal pain and low-grade fever.  Your evaluation did not identify a clear cause of your symptoms but was generally reassuring.  Please follow up as instructed above regarding today?s emergent visit and the symptoms that are bothering you.  Given your post-operative constipation, we also strongly recommend you try one or more of the over-the-counter medications listed below.  My recommendation is to try Colace, Senna, and Miralax according to the label instructions; this combination should help get your bowels moving again:   Return to the ED if your abdominal pain worsens or fails to improve, you develop bloody vomiting, bloody diarrhea, you are unable to tolerate fluids due to vomiting, fever greater than 101, or other symptoms that concern you.

## 2017-03-15 NOTE — Progress Notes (Signed)
Pharmacy Antibiotic Note  Shelly Middleton is a 42 y.o. female admitted on 03/14/2017 with intra-abdominal infection.  Pharmacy has been consulted for zosyn dosing.  Plan: Zosyn 3.375g IV q8h (4 hour infusion).  Height: 5\' 4"  (162.6 cm) Weight: 149 lb (67.6 kg) IBW/kg (Calculated) : 54.7  Temp (24hrs), Avg:97.9 F (36.6 C), Min:97.9 F (36.6 C), Max:97.9 F (36.6 C)  Recent Labs  Lab 03/08/17 1255 03/14/17 2332  WBC 9.1 8.3  CREATININE  --  0.60    Estimated Creatinine Clearance: 86.6 mL/min (by C-G formula based on SCr of 0.6 mg/dL).    Allergies  Allergen Reactions  . Tape Rash    Reaction only happens on feet, the rest of the body is ok    Thank you for allowing pharmacy to be a part of this patient's care.  Tobie Lords, PharmD, BCPS Clinical Pharmacist 03/15/2017

## 2017-03-20 ENCOUNTER — Encounter: Payer: Self-pay | Admitting: Obstetrics and Gynecology

## 2017-03-20 ENCOUNTER — Ambulatory Visit (INDEPENDENT_AMBULATORY_CARE_PROVIDER_SITE_OTHER): Payer: BC Managed Care – PPO | Admitting: Obstetrics and Gynecology

## 2017-03-20 VITALS — BP 115/78 | HR 78 | Wt 147.1 lb

## 2017-03-20 DIAGNOSIS — Z79890 Hormone replacement therapy (postmenopausal): Secondary | ICD-10-CM

## 2017-03-20 DIAGNOSIS — Z9889 Other specified postprocedural states: Secondary | ICD-10-CM

## 2017-03-20 NOTE — Progress Notes (Signed)
HPI:      [name] is a 42 y.o. Shelly Middleton who LMP was Patient's last menstrual period was 03/04/2017 (exact date).  Subjective:   She presents today approximately 1 week from her surgery.  She is taking estrogen as directed.  She feels well.  Denies hot flashes.  She states that she is having minimal pain and not taking pain medication.  Denies vaginal bleeding.  Eating and using the bathroom without issue.    Hx: The following portions of the patient's history were reviewed and updated as appropriate:             She  has a past medical history of Endometriosis. She does not have any pertinent problems on file. She  has a past surgical history that includes Bunionectomy (Bilateral, 2002 (left), 2009 (right)); Colonoscopy with propofol (N/A, 11/02/2016); and Laparoscopic vaginal hysterectomy with salpingo oophorectomy (Bilateral, 03/11/2017). Her family history includes Breast cancer (age of onset: 49) in her maternal grandmother; CAD in her father; Endometriosis in her mother; Hypertension in her father and mother. She  reports that  has never smoked. she has never used smokeless tobacco. She reports that she does not drink alcohol or use drugs. She is allergic to tape.       Review of Systems:  Review of Systems  Constitutional: Denied constitutional symptoms, night sweats, recent illness, fatigue, fever, insomnia and weight loss.  Eyes: Denied eye symptoms, eye pain, photophobia, vision change and visual disturbance.  Ears/Nose/Throat/Neck: Denied ear, nose, throat or neck symptoms, hearing loss, nasal discharge, sinus congestion and sore throat.  Cardiovascular: Denied cardiovascular symptoms, arrhythmia, chest pain/pressure, edema, exercise intolerance, orthopnea and palpitations.  Respiratory: Denied pulmonary symptoms, asthma, pleuritic pain, productive sputum, cough, dyspnea and wheezing.  Gastrointestinal: Denied, gastro-esophageal reflux, melena, nausea and vomiting.   Genitourinary: Denied genitourinary symptoms including symptomatic vaginal discharge, pelvic relaxation issues, and urinary complaints.  Musculoskeletal: Denied musculoskeletal symptoms, stiffness, swelling, muscle weakness and myalgia.  Dermatologic: Denied dermatology symptoms, rash and scar.  Neurologic: Denied neurology symptoms, dizziness, headache, neck pain and syncope.  Psychiatric: Denied psychiatric symptoms, anxiety and depression.  Endocrine: Denied endocrine symptoms including hot flashes and night sweats.   Meds:   Current Outpatient Medications on File Prior to Visit  Medication Sig Dispense Refill  . estrogens, conjugated, (PREMARIN) 0.9 MG tablet Take 1 tablet (0.9 mg total) by mouth daily. 50 tablet 0  . FLUoxetine (PROZAC) 20 MG capsule Take 20 mg by mouth every morning.   3  . metroNIDAZOLE (FLAGYL) 500 MG tablet Take 1 tablet by mouth 2 (two) times daily.  0  . oxyCODONE-acetaminophen (PERCOCET/ROXICET) 5-325 MG tablet Take 1-2 tablets by mouth every 4 (four) hours as needed for moderate pain. (Patient not taking: Reported on 03/20/2017) 30 tablet 0   No current facility-administered medications on file prior to visit.     Objective:     Vitals:   03/20/17 0851  BP: 115/78  Pulse: 78               Abdomen: Soft.  Non-tender.  No masses.  No HSM.  Incision/s: Intact.  Healing well.  No erythema.  No drainage.      Assessment:    [id number] Patient Active Problem List   Diagnosis Date Noted  . Post-operative state 03/11/2017  . Abnormal CT scan, colon   . Abnormal abdominal CT scan   . Abdominal pain 10/31/2016  . Abdominal pain, RLQ      1. Post-operative  state   2. Postmenopausal hormone therapy     Patient doing very well postop.  HRT seems appropriate.   Plan:            1.  Continue postop recovery.  Continue HRT.  2.  Surgery discussed in detail including endometriomas and endometriosis as well as pelvic adhesions. Orders No orders of  the defined types were placed in this encounter.   No orders of the defined types were placed in this encounter.     F/U  Return in about 4 weeks (around 04/17/2017).  Finis Bud, M.D. 03/20/2017 9:51 AM

## 2017-03-25 ENCOUNTER — Encounter: Payer: BC Managed Care – PPO | Admitting: Obstetrics and Gynecology

## 2017-03-25 ENCOUNTER — Ambulatory Visit (INDEPENDENT_AMBULATORY_CARE_PROVIDER_SITE_OTHER): Payer: BC Managed Care – PPO | Admitting: Obstetrics and Gynecology

## 2017-03-25 ENCOUNTER — Encounter: Payer: Self-pay | Admitting: Obstetrics and Gynecology

## 2017-03-25 VITALS — BP 100/68 | HR 86 | Temp 97.6°F | Ht 64.0 in | Wt 150.0 lb

## 2017-03-25 DIAGNOSIS — N3001 Acute cystitis with hematuria: Secondary | ICD-10-CM

## 2017-03-25 DIAGNOSIS — R300 Dysuria: Secondary | ICD-10-CM | POA: Diagnosis not present

## 2017-03-25 LAB — POCT URINALYSIS DIPSTICK
Bilirubin, UA: NEGATIVE
CLARITY UA: NEGATIVE
Glucose, UA: NEGATIVE
Ketones, UA: NEGATIVE
Nitrite, UA: NEGATIVE
Odor: NEGATIVE
Protein, UA: NEGATIVE
SPEC GRAV UA: 1.01 (ref 1.010–1.025)
Urobilinogen, UA: 0.2 E.U./dL
pH, UA: 7.5 (ref 5.0–8.0)

## 2017-03-25 MED ORDER — NITROFURANTOIN MONOHYD MACRO 100 MG PO CAPS: 100.0000 mg | ORAL_CAPSULE | Freq: Two times a day (BID) | ORAL | 1 refills | Status: DC

## 2017-03-25 NOTE — Progress Notes (Signed)
HPI:      [name] is a 42 y.o. [phone] who LMP was Patient's last menstrual period was 03/04/2017 (exact date).  Subjective:   She presents today stating that over the last few days she has had pressure and pelvic discomfort when urinating.  She reports that she is otherwise well.  Reports her incisions are doing well.  Just general pelvic discomfort especially with urination.    Hx: The following portions of the patient's history were reviewed and updated as appropriate:             She  has a past medical history of Endometriosis. She does not have any pertinent problems on file. She  has a past surgical history that includes Bunionectomy (Bilateral, 2002 (left), 2009 (right)); Colonoscopy with propofol (N/A, 11/02/2016); and Laparoscopic vaginal hysterectomy with salpingo oophorectomy (Bilateral, 03/11/2017). Her family history includes Breast cancer (age of onset: 66) in her maternal grandmother; CAD in her father; Endometriosis in her mother; Hypertension in her father and mother. She  reports that  has never smoked. she has never used smokeless tobacco. She reports that she does not drink alcohol or use drugs. She is allergic to tape.       Review of Systems:  Review of Systems  Constitutional: Denied constitutional symptoms, night sweats, recent illness, fatigue, fever, insomnia and weight loss.  Eyes: Denied eye symptoms, eye pain, photophobia, vision change and visual disturbance.  Ears/Nose/Throat/Neck: Denied ear, nose, throat or neck symptoms, hearing loss, nasal discharge, sinus congestion and sore throat.  Cardiovascular: Denied cardiovascular symptoms, arrhythmia, chest pain/pressure, edema, exercise intolerance, orthopnea and palpitations.  Respiratory: Denied pulmonary symptoms, asthma, pleuritic pain, productive sputum, cough, dyspnea and wheezing.  Gastrointestinal: Denied, gastro-esophageal reflux, melena, nausea and vomiting.  Genitourinary: See HPI for additional  information.  Musculoskeletal: Denied musculoskeletal symptoms, stiffness, swelling, muscle weakness and myalgia.  Dermatologic: Denied dermatology symptoms, rash and scar.  Neurologic: Denied neurology symptoms, dizziness, headache, neck pain and syncope.  Psychiatric: Denied psychiatric symptoms, anxiety and depression.  Endocrine: Denied endocrine symptoms including hot flashes and night sweats.   Meds:   Current Outpatient Medications on File Prior to Visit  Medication Sig Dispense Refill  . estrogens, conjugated, (PREMARIN) 0.9 MG tablet Take 1 tablet (0.9 mg total) by mouth daily. 50 tablet 0  . FLUoxetine (PROZAC) 20 MG capsule Take 20 mg by mouth every morning.   3   No current facility-administered medications on file prior to visit.     Objective:     Vitals:   03/25/17 1339  BP: 100/68  Pulse: 86  Temp: 97.6 F (36.4 C)              UA consistent with UTI-see report.  Assessment:    [id number] Patient Active Problem List   Diagnosis Date Noted  . Post-operative state 03/11/2017  . Abnormal CT scan, colon   . Abnormal abdominal CT scan   . Abdominal pain 10/31/2016  . Abdominal pain, RLQ      1. Acute cystitis with hematuria   2. Dysuria        Plan:            1.  Macrobid  2.Discussed the use of AZO if needed. Orders Orders Placed This Encounter  Procedures  . Urine Culture  . POCT urinalysis dipstick     Meds ordered this encounter  Medications  . nitrofurantoin, macrocrystal-monohydrate, (MACROBID) 100 MG capsule    Sig: Take 1 capsule (100 mg  total) by mouth 2 (two) times daily.    Dispense:  14 capsule    Refill:  1      F/U  No Follow-up on file.  Finis Bud, M.D. 03/25/2017 2:21 PM

## 2017-03-27 LAB — URINE CULTURE

## 2017-04-22 ENCOUNTER — Ambulatory Visit (INDEPENDENT_AMBULATORY_CARE_PROVIDER_SITE_OTHER): Payer: BC Managed Care – PPO | Admitting: Obstetrics and Gynecology

## 2017-04-22 ENCOUNTER — Encounter: Payer: Self-pay | Admitting: Obstetrics and Gynecology

## 2017-04-22 VITALS — BP 132/82 | HR 76 | Ht 64.0 in | Wt 153.3 lb

## 2017-04-22 DIAGNOSIS — N951 Menopausal and female climacteric states: Secondary | ICD-10-CM

## 2017-04-22 DIAGNOSIS — Z9889 Other specified postprocedural states: Secondary | ICD-10-CM

## 2017-04-22 DIAGNOSIS — R300 Dysuria: Secondary | ICD-10-CM | POA: Diagnosis not present

## 2017-04-22 LAB — POCT URINALYSIS DIPSTICK
Bilirubin, UA: NEGATIVE
Glucose, UA: NEGATIVE
KETONES UA: NEGATIVE
LEUKOCYTES UA: NEGATIVE
NITRITE UA: NEGATIVE
PROTEIN UA: NEGATIVE
RBC UA: NEGATIVE
SPEC GRAV UA: 1.015 (ref 1.010–1.025)
UROBILINOGEN UA: 0.2 U/dL
pH, UA: 6.5 (ref 5.0–8.0)

## 2017-04-22 MED ORDER — ESTROGENS CONJUGATED 0.9 MG PO TABS: 0.9000 mg | ORAL_TABLET | Freq: Every day | ORAL | 3 refills | Status: DC

## 2017-04-22 NOTE — Progress Notes (Signed)
HPI:      [name] is a 42 y.o. [phone] who LMP was Patient's last menstrual period was 03/04/2017 (exact date).  Subjective:   She presents today approximately 7 weeks postop.  She reports she is doing well.  She would like to go back to work.  She is ambulating voiding and having bowel movements without difficulty.  She does complain of occasional pelvic pressure symptoms with urination.  She reports no vaginal bleeding. She is taking her Premarin as directed and occasionally feels warm but does not seem to be having true hot flashes.    Hx: The following portions of the patient's history were reviewed and updated as appropriate:             She  has a past medical history of Endometriosis. She does not have any pertinent problems on file. She  has a past surgical history that includes Bunionectomy (Bilateral, 2002 (left), 2009 (right)); Colonoscopy with propofol (N/A, 11/02/2016); and Laparoscopic vaginal hysterectomy with salpingo oophorectomy (Bilateral, 03/11/2017). Her family history includes Breast cancer (age of onset: 22) in her maternal grandmother; CAD in her father; Endometriosis in her mother; Hypertension in her father and mother. She  reports that  has never smoked. she has never used smokeless tobacco. She reports that she does not drink alcohol or use drugs. She has a current medication list which includes the following prescription(s): estrogens (conjugated), fluoxetine, and nitrofurantoin (macrocrystal-monohydrate). She is allergic to tape.       Review of Systems:  Review of Systems  Constitutional: Denied constitutional symptoms, night sweats, recent illness, fatigue, fever, insomnia and weight loss.  Eyes: Denied eye symptoms, eye pain, photophobia, vision change and visual disturbance.  Ears/Nose/Throat/Neck: Denied ear, nose, throat or neck symptoms, hearing loss, nasal discharge, sinus congestion and sore throat.  Cardiovascular: Denied cardiovascular symptoms,  arrhythmia, chest pain/pressure, edema, exercise intolerance, orthopnea and palpitations.  Respiratory: Denied pulmonary symptoms, asthma, pleuritic pain, productive sputum, cough, dyspnea and wheezing.  Gastrointestinal: Denied, gastro-esophageal reflux, melena, nausea and vomiting.  Genitourinary: Denied genitourinary symptoms including symptomatic vaginal discharge, pelvic relaxation issues, and urinary complaints.  Musculoskeletal: Denied musculoskeletal symptoms, stiffness, swelling, muscle weakness and myalgia.  Dermatologic: Denied dermatology symptoms, rash and scar.  Neurologic: Denied neurology symptoms, dizziness, headache, neck pain and syncope.  Psychiatric: Denied psychiatric symptoms, anxiety and depression.  Endocrine: Denied endocrine symptoms including hot flashes and night sweats.   Meds:   Current Outpatient Medications on File Prior to Visit  Medication Sig Dispense Refill  . FLUoxetine (PROZAC) 20 MG capsule Take 20 mg by mouth every morning.   3  . nitrofurantoin, macrocrystal-monohydrate, (MACROBID) 100 MG capsule Take 1 capsule (100 mg total) by mouth 2 (two) times daily. (Patient not taking: Reported on 04/22/2017) 14 capsule 1   No current facility-administered medications on file prior to visit.     Objective:     Vitals:   04/22/17 0940  BP: 132/82  Pulse: 76               Abdomen: Soft.  Non-tender.  No masses.  No HSM.  Incision/s: Intact.  Healing well.  No erythema.  No drainage.    Pelvic:   Vulva: Normal appearance.  No lesions.  Vagina: No lesions or abnormalities noted.  Support: Normal pelvic support.  Urethra No masses tenderness or scarring.  Meatus Normal size without lesions or prolapse  Vag Cuff: Intact.  No lesions.  Anus: Normal exam.  No lesions.  Perineum:  Normal exam.  No lesions.        Bimanual   Adnexae: No masses.  Non-tender to palpation.  Cuff: Negative for abnormality.      Assessment:    [id number] Patient Active  Problem List   Diagnosis Date Noted  . Post-operative state 03/11/2017  . Abnormal CT scan, colon   . Abnormal abdominal CT scan   . Abdominal pain 10/31/2016  . Abdominal pain, RLQ      1. Post-operative state   2. Dysuria   3. Symptomatic menopausal or female climacteric states     Patient doing well postop.  Some bacteria noted in urine with symptoms-will send for C&S.   Plan:            1.  Patient may resume normal activities including return to work.  2.  Continue Premarin.  3.  Urine for C&S.  Orders Orders Placed This Encounter  Procedures  . Urine Culture  . POCT urinalysis dipstick     Meds ordered this encounter  Medications  . estrogens, conjugated, (PREMARIN) 0.9 MG tablet    Sig: Take 1 tablet (0.9 mg total) by mouth daily.    Dispense:  50 tablet    Refill:  3      F/U  Return in about 3 months (around 07/20/2017) for follow up.  Finis Bud, M.D. 04/22/2017 10:26 AM

## 2017-04-24 LAB — URINE CULTURE: Organism ID, Bacteria: NO GROWTH

## 2017-04-29 ENCOUNTER — Telehealth: Payer: Self-pay | Admitting: Obstetrics and Gynecology

## 2017-04-29 NOTE — Telephone Encounter (Signed)
Pt aware cns from 2/25 is neg

## 2017-04-29 NOTE — Telephone Encounter (Signed)
The patient called and stated that at her last appointment bacteria was found and she would like to know what can be done moving forward to treat it, No other information was disclosed other than wanting to speak with a nurse. Please advise.

## 2017-05-27 ENCOUNTER — Other Ambulatory Visit: Payer: Self-pay | Admitting: Student

## 2017-05-27 DIAGNOSIS — Z1231 Encounter for screening mammogram for malignant neoplasm of breast: Secondary | ICD-10-CM

## 2017-08-12 ENCOUNTER — Ambulatory Visit
Admission: RE | Admit: 2017-08-12 | Discharge: 2017-08-12 | Disposition: A | Discharge status: Home / Self Care | Payer: BC Managed Care – PPO | Source: Ambulatory Visit | Attending: Student | Admitting: Student

## 2017-08-12 DIAGNOSIS — Z1231 Encounter for screening mammogram for malignant neoplasm of breast: Secondary | ICD-10-CM | POA: Diagnosis not present

## 2017-08-19 ENCOUNTER — Inpatient Hospital Stay
Admission: RE | Admit: 2017-08-19 | Discharge: 2017-08-19 | Disposition: A | Discharge status: Home / Self Care | Payer: Self-pay | Source: Ambulatory Visit | Attending: *Deleted | Admitting: *Deleted

## 2017-08-19 ENCOUNTER — Other Ambulatory Visit: Payer: Self-pay | Admitting: *Deleted

## 2017-08-19 DIAGNOSIS — Z9289 Personal history of other medical treatment: Secondary | ICD-10-CM

## 2017-09-17 ENCOUNTER — Ambulatory Visit (INDEPENDENT_AMBULATORY_CARE_PROVIDER_SITE_OTHER): Payer: BC Managed Care – PPO | Admitting: Obstetrics and Gynecology

## 2017-09-17 ENCOUNTER — Encounter: Payer: Self-pay | Admitting: Obstetrics and Gynecology

## 2017-09-17 VITALS — BP 134/87 | HR 84 | Ht 63.0 in | Wt 156.4 lb

## 2017-09-17 DIAGNOSIS — N951 Menopausal and female climacteric states: Secondary | ICD-10-CM | POA: Diagnosis not present

## 2017-09-17 MED ORDER — ESTROGENS CONJUGATED 0.625 MG PO TABS: 1.2500 mg | ORAL_TABLET | Freq: Every day | ORAL | 0 refills | Status: DC

## 2017-09-17 NOTE — Progress Notes (Signed)
Pt is here with c/o hot flashes. Is using premarin 0.9mg  tabs

## 2017-09-17 NOTE — Progress Notes (Signed)
HPI:      [name] is a 42 y.o. [phone] who LMP was Patient's last menstrual period was 03/04/2017 (exact date).  Subjective:   She presents today stating that taking Premarin 0.9 daily has not resolved her hot flashes.  She is having no pain and is very happy with her surgery from that standpoint but understandably uncomfortable with her daily hot flashes.    Hx: The following portions of the patient's history were reviewed and updated as appropriate:             She  has a past medical history of Endometriosis. She does not have any pertinent problems on file. She  has a past surgical history that includes Bunionectomy (Bilateral, 2002 (left), 2009 (right)); Colonoscopy with propofol (N/A, 11/02/2016); Laparoscopic vaginal hysterectomy with salpingo oophorectomy (Bilateral, 03/11/2017); Abdominal hysterectomy; and Oophorectomy. Her family history includes Breast cancer in her maternal aunt; Breast cancer (age of onset: 84) in her maternal grandmother; CAD in her father; Endometriosis in her mother; Hypertension in her father and mother. She  reports that she has never smoked. She has never used smokeless tobacco. She reports that she does not drink alcohol or use drugs. She has a current medication list which includes the following prescription(s): fluoxetine, nabumetone, and premarin. She is allergic to tape.       Review of Systems:  Review of Systems  Constitutional: Denied constitutional symptoms, night sweats, recent illness, fatigue, fever, insomnia and weight loss.  Eyes: Denied eye symptoms, eye pain, photophobia, vision change and visual disturbance.  Ears/Nose/Throat/Neck: Denied ear, nose, throat or neck symptoms, hearing loss, nasal discharge, sinus congestion and sore throat.  Cardiovascular: Denied cardiovascular symptoms, arrhythmia, chest pain/pressure, edema, exercise intolerance, orthopnea and palpitations.  Respiratory: Denied pulmonary symptoms, asthma, pleuritic  pain, productive sputum, cough, dyspnea and wheezing.  Gastrointestinal: Denied, gastro-esophageal reflux, melena, nausea and vomiting.  Genitourinary: Denied genitourinary symptoms including symptomatic vaginal discharge, pelvic relaxation issues, and urinary complaints.  Musculoskeletal: Denied musculoskeletal symptoms, stiffness, swelling, muscle weakness and myalgia.  Dermatologic: Denied dermatology symptoms, rash and scar.  Neurologic: Denied neurology symptoms, dizziness, headache, neck pain and syncope.  Psychiatric: Denied psychiatric symptoms, anxiety and depression.  Endocrine: See HPI for additional information.   Meds:   Current Outpatient Medications on File Prior to Visit  Medication Sig Dispense Refill  . FLUoxetine (PROZAC) 20 MG capsule Take 20 mg by mouth every morning.   3  . nabumetone (RELAFEN) 500 MG tablet   0  . [DISCONTINUED] PREMARIN 0.9 MG tablet   3   No current facility-administered medications on file prior to visit.     Objective:     Vitals:   09/17/17 1453  BP: 134/87  Pulse: 84                Assessment:    [id number] Patient Active Problem List   Diagnosis Date Noted  . Post-operative state 03/11/2017  . Abnormal CT scan, colon   . Abnormal abdominal CT scan   . Abdominal pain 10/31/2016  . Abdominal pain, RLQ      1. Symptomatic menopausal or female climacteric states     Despite use of daily Premarin.   Plan:            1.  We discussed several options regarding increasing her Premarin dose to try to stop the hot flashes.  She has chosen to increase her dose of Premarin to 1.25 and will try this for 30 days.  Should she fail she would consider use of OCPs.  We have also briefly discussed the possibility of use of Estratest and this will be her third option. Orders No orders of the defined types were placed in this encounter.   No orders of the defined types were placed in this encounter.     F/U  Return for Annual  Physical.  Finis Bud, M.D. 09/17/2017 3:58 PM

## 2017-10-12 ENCOUNTER — Other Ambulatory Visit: Payer: Self-pay | Admitting: Obstetrics and Gynecology

## 2017-10-12 DIAGNOSIS — N951 Menopausal and female climacteric states: Secondary | ICD-10-CM

## 2017-10-16 ENCOUNTER — Telehealth: Payer: Self-pay | Admitting: Obstetrics and Gynecology

## 2017-10-16 NOTE — Telephone Encounter (Signed)
Patient called stating the premarin is not working and wants to try another option. Thanks

## 2017-10-21 ENCOUNTER — Other Ambulatory Visit: Payer: Self-pay

## 2017-10-21 ENCOUNTER — Telehealth: Payer: Self-pay

## 2017-10-21 DIAGNOSIS — N951 Menopausal and female climacteric states: Secondary | ICD-10-CM

## 2017-10-21 MED ORDER — LEVONORGEST-ETH ESTRAD 91-DAY 0.15-0.03 &0.01 MG PO TABS: 1.0000 | ORAL_TABLET | Freq: Every day | ORAL | 4 refills | Status: DC

## 2017-10-21 NOTE — Telephone Encounter (Signed)
Discussed OCP with Pt. Waiting on specific Rx information from Dr Amalia Hailey.

## 2017-10-21 NOTE — Telephone Encounter (Signed)
Informed Pt of Rx of Seasonique at pharmacy

## 2017-11-07 ENCOUNTER — Ambulatory Visit: Payer: BC Managed Care – PPO | Admitting: Dietician

## 2017-11-07 ENCOUNTER — Encounter: Payer: Self-pay | Admitting: Dietician

## 2017-11-07 NOTE — Progress Notes (Signed)
Patient cancelled her initial MNT appointment for today, and does not wish to reschedule at this time. Sent letter to referring provider.

## 2017-11-10 ENCOUNTER — Other Ambulatory Visit: Payer: Self-pay | Admitting: Obstetrics and Gynecology

## 2017-11-17 ENCOUNTER — Encounter: Payer: Self-pay | Admitting: Emergency Medicine

## 2017-11-17 ENCOUNTER — Emergency Department
Admission: EM | Admit: 2017-11-17 | Discharge: 2017-11-17 | Disposition: A | Discharge status: Home / Self Care | Payer: BC Managed Care – PPO | Attending: Emergency Medicine | Admitting: Emergency Medicine

## 2017-11-17 DIAGNOSIS — T887XXA Unspecified adverse effect of drug or medicament, initial encounter: Secondary | ICD-10-CM | POA: Diagnosis not present

## 2017-11-17 DIAGNOSIS — T50905A Adverse effect of unspecified drugs, medicaments and biological substances, initial encounter: Secondary | ICD-10-CM

## 2017-11-17 DIAGNOSIS — Z79899 Other long term (current) drug therapy: Secondary | ICD-10-CM | POA: Insufficient documentation

## 2017-11-17 DIAGNOSIS — Y658 Other specified misadventures during surgical and medical care: Secondary | ICD-10-CM | POA: Insufficient documentation

## 2017-11-17 DIAGNOSIS — R21 Rash and other nonspecific skin eruption: Secondary | ICD-10-CM | POA: Insufficient documentation

## 2017-11-17 MED ORDER — METHYLPREDNISOLONE SODIUM SUCC 125 MG IJ SOLR
125.0000 mg | Freq: Once | INTRAMUSCULAR | Status: AC
Start: 2017-11-17 — End: 2017-11-17
  Administered 2017-11-17: 125 mg via INTRAMUSCULAR
  Filled 2017-11-17: qty 2

## 2017-11-17 MED ORDER — DIPHENHYDRAMINE HCL 25 MG PO CAPS: 25.0000 mg | ORAL_CAPSULE | ORAL | 0 refills | Status: DC | PRN

## 2017-11-17 MED ORDER — DIPHENHYDRAMINE HCL 50 MG/ML IJ SOLN
25.0000 mg | Freq: Once | INTRAMUSCULAR | Status: AC
  Administered 2017-11-17: 25 mg via INTRAMUSCULAR
  Filled 2017-11-17: qty 1

## 2017-11-17 MED ORDER — PREDNISONE 10 MG PO TABS: ORAL_TABLET | ORAL | 0 refills | Status: DC

## 2017-11-17 NOTE — ED Triage Notes (Signed)
Patient states that she started a new medication three days ago and started developing a rash yesterday. Patient with rash to bilateral arm, legs and trunk.

## 2017-11-17 NOTE — ED Provider Notes (Signed)
[facility] Emergency Department Provider Note  ____________________________________________  Time seen: Approximately 8:12 PM  I have reviewed the triage vital signs and the nursing notes.   HISTORY  Chief Complaint Allergic Reaction    HPI Shelly Middleton is a 42 y.o. female that presents to the emergency department for evaluation of rash to entire body today.  Rash itches. Rash is not painful. Patient states that she just started a new medication, contrave, for weight loss.  She has had 3 doses.  No shortness of breath, chest pain. She denies fever, swelling, body aches.   Past Medical History:  Diagnosis Date  . Endometriosis     Patient Active Problem List   Diagnosis Date Noted  . Post-operative state 03/11/2017  . Abnormal CT scan, colon   . Abnormal abdominal CT scan   . Abdominal pain 10/31/2016  . Abdominal pain, RLQ     Past Surgical History:  Procedure Laterality Date  . ABDOMINAL HYSTERECTOMY    . BUNIONECTOMY Bilateral 2002 (left), 2009 (right)  . COLONOSCOPY WITH PROPOFOL N/A 11/02/2016   Procedure: COLONOSCOPY WITH PROPOFOL;  Surgeon: Lucilla Lame, MD;  Location: [facility] ENDOSCOPY;  Service: Endoscopy;  Laterality: N/A;  . LAPAROSCOPIC VAGINAL HYSTERECTOMY WITH SALPINGO OOPHORECTOMY Bilateral 03/11/2017   Procedure: LAPAROSCOPIC ASSISTED VAGINAL HYSTERECTOMY WITH BILATERAL OOPHERECTOMY;  Surgeon: Harlin Heys, MD;  Location: [facility] ORS;  Service: Gynecology;  Laterality: Bilateral;  . OOPHORECTOMY      Prior to Admission medications   Medication Sig Start Date End Date Taking? Authorizing Provider  diphenhydrAMINE (BENADRYL) 25 mg capsule Take 1 capsule (25 mg total) by mouth every 4 (four) hours as needed. 11/17/17 11/17/18  Laban Emperor, PA-C  FLUoxetine (PROZAC) 20 MG capsule Take 20 mg by mouth every morning.  01/07/17   [provider]  Levonorgestrel-Ethinyl Estradiol (AMETHIA,CAMRESE) 0.15-0.03 &0.01 MG tablet Take 1  tablet by mouth daily. 10/21/17   Harlin Heys, MD  nabumetone (RELAFEN) 500 MG tablet  08/12/17   [provider]  predniSONE (DELTASONE) 10 MG tablet Take 6 tablets on day 1, take 5 tablets on day 2, take 4 tablets on day 3, take 3 tablets on day 4, take 2 tablets on day 5, take 1 tablet on day 6 11/17/17   Laban Emperor, PA-C  PREMARIN 0.625 MG tablet TAKE 2 TABLETS(1.25 MG) BY MOUTH DAILY 10/14/17   Harlin Heys, MD    Allergies Tape  Family History  Problem Relation Age of Onset  . Hypertension Mother   . Endometriosis Mother   . Hypertension Father   . CAD Father   . Breast cancer Maternal Grandmother 78       and possibly 1 or 2 of grandmother's sisters  . Breast cancer Maternal Aunt     Social History Social History   Tobacco Use  . Smoking status: Never Smoker  . Smokeless tobacco: Never Used  Substance Use Topics  . Alcohol use: Yes    Frequency: Never    Comment: occ  . Drug use: No     Review of Systems  Constitutional: No fever/chills Cardiovascular: No chest pain. Respiratory: No SOB. Gastrointestinal: No abdominal pain.  No nausea, no vomiting.  Musculoskeletal: Negative for musculoskeletal pain. Skin: Negative for abrasions, lacerations, ecchymosis.  Positive for rash. Neurological: Negative for numbness or tingling   ____________________________________________   PHYSICAL EXAM:  VITAL SIGNS: ED Triage Vitals  Enc Vitals Group     BP 11/17/17 1943 137/80  Pulse Rate 11/17/17 1943 84     Resp 11/17/17 1943 18     Temp 11/17/17 1943 98.4 F (36.9 C)     Temp Source 11/17/17 1943 Oral     SpO2 11/17/17 1943 99 %     Weight 11/17/17 1945 160 lb (72.6 kg)     Height 11/17/17 1945 5\' 3"  (1.6 m)     Head Circumference --      Peak Flow --      Pain Score 11/17/17 1945 4     Pain Loc --      Pain Edu? --      Excl. in [place]? --      Constitutional: Alert and oriented. Well appearing and in no acute distress. Eyes:  Conjunctivae are normal. PERRL. EOMI. Head: Atraumatic. ENT:      Ears:      Nose: No congestion/rhinnorhea.      Mouth/Throat: Mucous membranes are moist.  Neck: No stridor.  Cardiovascular: Normal rate, regular rhythm.  Good peripheral circulation. Respiratory: Normal respiratory effort without tachypnea or retractions. Lungs CTAB. Good air entry to the bases with no decreased or absent breath sounds. Musculoskeletal: Full range of motion to all extremities. No gross deformities appreciated.  Neurologic:  Normal speech and language. No gross focal neurologic deficits are appreciated.  Skin:  Skin is warm, dry and intact. Pink macules and papules covering arms, abdomen, back, legs. None tender to palpation. No mucus membrane lesions. No target lesions. No blisters. Psychiatric: Mood and affect are normal. Speech and behavior are normal. Patient exhibits appropriate insight and judgement.   ____________________________________________   LABS (all labs ordered are listed, but only abnormal results are displayed)  Labs Reviewed - No data to display ____________________________________________  EKG   ____________________________________________  RADIOLOGY  No results found.  ____________________________________________    PROCEDURES  Procedure(s) performed:    Procedures    Medications  methylPREDNISolone sodium succinate (SOLU-MEDROL) 125 mg/2 mL injection 125 mg (125 mg Intramuscular Given 11/17/17 2054)  diphenhydrAMINE (BENADRYL) injection 25 mg (25 mg Intramuscular Given 11/17/17 2054)     ____________________________________________   INITIAL IMPRESSION / ASSESSMENT AND PLAN / ED COURSE  Pertinent labs & imaging results that were available during my care of the patient were reviewed by me and considered in my medical decision making (see chart for details).  Review of the [place] CSRS was performed in accordance of the [place] prior to dispensing any controlled  drugs.     Patient's diagnosis is consistent with medication reaction.  Vital signs and exam are reassuring.  No indication of Stevens-Johnson syndrome or multiform erythema.  IM Benadryl and Solu-Medrol were given.  Patient will be discharged home with prescriptions for prednisone and Benadryl.  Patient is to follow up with primary care as directed. Patient is given ED precautions to return to the ED for any worsening or new symptoms.     ____________________________________________  FINAL CLINICAL IMPRESSION(S) / ED DIAGNOSES  Final diagnoses:  Adverse effect of drug, initial encounter      NEW MEDICATIONS STARTED DURING THIS VISIT:  ED Discharge Orders         Ordered    predniSONE (DELTASONE) 10 MG tablet     11/17/17 2108    diphenhydrAMINE (BENADRYL) 25 mg capsule  Every 4 hours PRN     11/17/17 2108              This chart was dictated using voice recognition software/Dragon. Despite best efforts to  proofread, errors can occur which can change the meaning. Any change was purely unintentional.    Laban Emperor, PA-C 11/17/17 2223    Nena Polio, MD 11/18/17 [phone]

## 2017-11-17 NOTE — ED Notes (Signed)
Pt to the er for an allergic reaction from a new medication. Pt began taking contrave for weight loss on Friday and has taken a total of 3 doses. Pt now has a red rash over the the entire body. Pt reports it is beginning to itch.

## 2017-12-05 ENCOUNTER — Telehealth: Payer: Self-pay | Admitting: Obstetrics and Gynecology

## 2017-12-05 NOTE — Telephone Encounter (Signed)
Pt is concerned with weight gain since her hysterectomy in January 2019. Informed pt that weight gain after surgery is normal. Pt is exercising and eating healthy. Pt is interesting in the B12 therapy for weight management. Pt will look into it and decide if that is something she would like to pursue. Pt was satisfied with the conversation.

## 2017-12-05 NOTE — Telephone Encounter (Signed)
Patient called stating she has gained weight since having a hysterectomy in January. She would like some suggestions of what she can do to help her lose weight. Thanks

## 2018-01-28 ENCOUNTER — Ambulatory Visit (INDEPENDENT_AMBULATORY_CARE_PROVIDER_SITE_OTHER): Payer: BC Managed Care – PPO | Admitting: Obstetrics and Gynecology

## 2018-01-28 ENCOUNTER — Encounter: Payer: Self-pay | Admitting: Obstetrics and Gynecology

## 2018-01-28 VITALS — BP 129/88 | HR 80 | Ht 63.0 in | Wt 157.0 lb

## 2018-01-28 DIAGNOSIS — R102 Pelvic and perineal pain: Secondary | ICD-10-CM

## 2018-01-28 DIAGNOSIS — K4090 Unilateral inguinal hernia, without obstruction or gangrene, not specified as recurrent: Secondary | ICD-10-CM | POA: Diagnosis not present

## 2018-01-28 NOTE — Progress Notes (Signed)
HPI:      [name] is a 42 y.o. [phone] who LMP was Patient's last menstrual period was 03/04/2017 (exact date).  Subjective:   She presents today with complaint of right lower quadrant pain.  She reports his pain is significantly worse after a busy day.  She also complains of significant pain with intercourse in the same spot.  She states that bowel movements have no effect on the pain but occasionally urination causes the pain to occur. Of significant note patient has previously had a hysterectomy with bilateral oophorectomy.  She is currently taking OCPs for hormone replacement.  She denies hot flashes or other menopausal symptoms.    Hx: The following portions of the patient's history were reviewed and updated as appropriate:             She  has a past medical history of Endometriosis. She does not have any pertinent problems on file. She  has a past surgical history that includes Bunionectomy (Bilateral, 2002 (left), 2009 (right)); Colonoscopy with propofol (N/A, 11/02/2016); Laparoscopic vaginal hysterectomy with salpingo oophorectomy (Bilateral, 03/11/2017); Abdominal hysterectomy; and Oophorectomy. Her family history includes Breast cancer in her maternal aunt; Breast cancer (age of onset: 37) in her maternal grandmother; CAD in her father; Endometriosis in her mother; Hypertension in her father and mother. She  reports that she has never smoked. She has never used smokeless tobacco. She reports that she drinks alcohol. She reports that she does not use drugs. She has a current medication list which includes the following prescription(s): fluoxetine and levonorgestrel-ethinyl estradiol. She is allergic to tape.       Review of Systems:  Review of Systems  Constitutional: Denied constitutional symptoms, night sweats, recent illness, fatigue, fever, insomnia and weight loss.  Eyes: Denied eye symptoms, eye pain, photophobia, vision change and visual disturbance.   Ears/Nose/Throat/Neck: Denied ear, nose, throat or neck symptoms, hearing loss, nasal discharge, sinus congestion and sore throat.  Cardiovascular: Denied cardiovascular symptoms, arrhythmia, chest pain/pressure, edema, exercise intolerance, orthopnea and palpitations.  Respiratory: Denied pulmonary symptoms, asthma, pleuritic pain, productive sputum, cough, dyspnea and wheezing.  Gastrointestinal: Denied, gastro-esophageal reflux, melena, nausea and vomiting.  Genitourinary: See HPI for additional information.  Musculoskeletal: Denied musculoskeletal symptoms, stiffness, swelling, muscle weakness and myalgia.  Dermatologic: Denied dermatology symptoms, rash and scar.  Neurologic: Denied neurology symptoms, dizziness, headache, neck pain and syncope.  Psychiatric: Denied psychiatric symptoms, anxiety and depression.  Endocrine: Denied endocrine symptoms including hot flashes and night sweats.   Meds:   Current Outpatient Medications on File Prior to Visit  Medication Sig Dispense Refill  . FLUoxetine (PROZAC) 20 MG capsule Take 20 mg by mouth every morning.   3  . Levonorgestrel-Ethinyl Estradiol (AMETHIA,CAMRESE) 0.15-0.03 &0.01 MG tablet Take 1 tablet by mouth daily. 1 Package 4   No current facility-administered medications on file prior to visit.     Objective:     Vitals:   01/28/18 1432  BP: 129/88  Pulse: 80              Abdominal examination reveals pain in the right lower quadrant specifically the inguinal area.  There is no rebound there is a small amount of guarding and pain with palpation.  A definitive hernial sac is not palpated.  Abdominal palpation elicits the pain as localizing to the abdominal wall and not to internal organs.  Assessment:    [id number] Patient Active Problem List   Diagnosis Date Noted  . Post-operative state 03/11/2017  .  Abnormal CT scan, colon   . Abnormal abdominal CT scan   . Abdominal pain 10/31/2016  . Abdominal pain, RLQ      1.  Unilateral inguinal hernia without obstruction or gangrene, recurrence not specified   2. Pelvic pain in female     Her complaints of pain seems localized to the abdominal wall and seem most consistent with inguinal hernia.   Plan:            1.  Refer to general surgery for evaluation and management.  Patient states that it is affecting her life enough that she would desire surgery if this could fix her pain. Orders No orders of the defined types were placed in this encounter.   No orders of the defined types were placed in this encounter.     F/U  No follow-ups on file. I spent 17 minutes involved in the care of this patient of which greater than 50% was spent discussing pelvic pain, HRT, dyspareunia, inguinal hernia diagnosis and management.  Refer to general surgery.  All questions answered.  Finis Bud, M.D. 01/28/2018 3:01 PM

## 2018-01-28 NOTE — Progress Notes (Signed)
Pt here today complaining of pain during intercourse and lower right side abdominal pain for the past month.

## 2018-01-30 ENCOUNTER — Ambulatory Visit (INDEPENDENT_AMBULATORY_CARE_PROVIDER_SITE_OTHER): Payer: BC Managed Care – PPO | Admitting: General Surgery

## 2018-01-30 ENCOUNTER — Other Ambulatory Visit: Payer: Self-pay

## 2018-01-30 ENCOUNTER — Encounter: Payer: Self-pay | Admitting: General Surgery

## 2018-01-30 VITALS — BP 118/70 | HR 84 | Temp 98.1°F | Resp 13 | Ht 63.0 in | Wt 159.0 lb

## 2018-01-30 DIAGNOSIS — R1031 Right lower quadrant pain: Secondary | ICD-10-CM | POA: Insufficient documentation

## 2018-01-30 NOTE — Patient Instructions (Addendum)
The patient is aware to call back for any questions or new concerns.  Schedule laparoscopy surgery

## 2018-01-30 NOTE — Progress Notes (Signed)
Patient ID: Shelly Middleton, female   DOB: Mar 29, 1975, 42 y.o.   MRN: [id number]  Chief Complaint  Patient presents with  . Hernia    HPI Shelly Middleton is a 42 y.o. female.  Patient here today for an evaluation of a hernia referred by Dr Amalia Hailey.  She states that she noticed some pain for about a month that comes and goes but occurring daily in right groin. She states the area is sensitive. Denies any heavy lifting. Walking up and down steps as well as bending also hurts. No knot. No nausea, vomiting, constipation or diarrhea noted. She has been using ibuprofen which does not seem to help. She did notice discomfort when she rolled over in bed the other night. 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 [place] on 03-15-2017.  HPI  Past Medical History:  Diagnosis Date  . Anxiety   . Endometriosis     Past Surgical History:  Procedure Laterality Date  . BUNIONECTOMY Bilateral 2002 (left), 2009 (right)  . COLONOSCOPY WITH PROPOFOL N/A 11/02/2016   Procedure: COLONOSCOPY WITH PROPOFOL;  Surgeon: Lucilla Lame, MD;  Location[facility] ENDOSCOPY;  Service: Endoscopy;  Laterality: N/A;  . LAPAROSCOPIC VAGINAL HYSTERECTOMY WITH SALPINGO OOPHORECTOMY Bilateral 03/11/2017   Procedure: LAPAROSCOPIC ASSISTED VAGINAL HYSTERECTOMY WITH BILATERAL OOPHERECTOMY;  Surgeon: Harlin Heys, MD;  Location: [facility] ORS;  Service: Gynecology;  Laterality: Bilateral;  . OOPHORECTOMY      Family History  Problem Relation Age of Onset  . Hypertension Mother   . Endometriosis Mother   . Hypertension Father   . CAD Father   . Breast cancer Maternal Grandmother 78       and possibly 1 or 2 of grandmother's sisters  . Breast cancer Maternal Aunt     Social History Social History   Tobacco Use  . Smoking status: Never Smoker  . Smokeless tobacco: Never Used  Substance Use Topics  . Alcohol use: Yes    Frequency: Never    Comment: occ  . Drug use: No    Allergies  Allergen Reactions  . Tape Rash     Reaction only happens on feet, the rest of the body is ok    Current Outpatient Medications  Medication Sig Dispense Refill  . FLUoxetine (PROZAC) 20 MG capsule Take 20 mg by mouth every morning.   3  . ibuprofen (ADVIL,MOTRIN) 600 MG tablet Take 600 mg by mouth 2 (two) times daily.     . Levonorgestrel-Ethinyl Estradiol (AMETHIA,CAMRESE) 0.15-0.03 &0.01 MG tablet Take 1 tablet by mouth daily. 1 Package 4   No current facility-administered medications for this visit.     Review of Systems Review of Systems  Constitutional: Negative.   Respiratory: Negative.   Cardiovascular: Negative.     Blood pressure 118/70, pulse 84, temperature 98.1 F (36.7 C), temperature source Skin, resp. rate 13, height 5\' 3"  (1.6 m), weight 159 lb (72.1 kg), last menstrual period 03/04/2017, SpO2 98 %.  Physical Exam Physical Exam  Constitutional: She is oriented to person, place, and time. She appears well-developed and well-nourished.  HENT:  Mouth/Throat: Oropharynx is clear and moist.  Eyes: Conjunctivae are normal. No scleral icterus.  Neck: Neck supple.  Cardiovascular: Normal rate, regular rhythm and normal heart sounds.  Pulmonary/Chest: Effort normal and breath sounds normal. Right breast exhibits no inverted nipple, no mass, no nipple discharge, no skin change and no tenderness. Left breast exhibits no inverted nipple, no mass, no nipple discharge, no skin change and no  tenderness.  Abdominal: Soft. Normal appearance and bowel sounds are normal. There is tenderness.    Mild tenderness right groin.  Lymphadenopathy:    She has no cervical adenopathy.    She has no axillary adenopathy.  Neurological: She is alert and oriented to person, place, and time.  Skin: Skin is warm and dry.  Psychiatric: Her behavior is normal.    Data Reviewed March 11, 2017 operative note reviewed.  Extensive endometriosis.  Hysterectomy and bilateral salpingo-oophorectomy completed.  800 cc blood  loss.  March 15, 2017 CT of the abdomen and pelvis reviewed.  Study obtained for abdominal pain, fever post hysterectomy. No evidence of clear-cut fascial defect.  Notes from Dr. Amalia Hailey reviewed who did obtain a history of progressive symptoms to the course of the day.  Not confirmed on today's questioning.  Assessment    Right groin pain without clear evidence of an inguinal hernia, unresponsive to Motrin 600 mg twice daily for the last month.    Plan    The pros and cons of operative exploration were reviewed.  With the ever so subtle findings of mild fullness in this area without a clear defect, I have recommended that we look with the laparoscope and hopefully be able to visualize the area.  No attempt would be made to take down extensive adhesions.  If adhesions are noted in this area, and as some of her pain is more deeply seated, would plan to treat her with gabapentin prior to considering open exploration.  If there is better evidence with pneumoperitoneum of a distinct bulge in the right groin, would proceed directly to open inguinal hernia repair at that time.  The role of prosthetic mesh was reviewed.  I anticipate she will be able to recover to enjoy her cruise scheduled for February 14, 2018.         HPI, Physical Exam, Assessment and Plan have been scribed under the direction and in the presence of Robert Bellow, MD. Karie Fetch, RN   I have completed the exam and reviewed the above documentation for accuracy and completeness.  I agree with the above.  Haematologist has been used and any errors in dictation or transcription are unintentional.  Hervey Ard, M.D., F.A.C.S.  Forest Gleason Sharyn Brilliant 01/30/2018, 8:11 PM  Patient's surgery has been scheduled for 02-07-18 at [facility] with Dr. Bary Castilla. The patient was requesting to have surgery Friday of next week due to her schedule.   The patient is aware she will be contacted by the [facility]  to complete a phone interview sometime in the near future.  The patient is aware to call the office should she have further questions.   Dominga Ferry, CMA

## 2018-02-04 ENCOUNTER — Other Ambulatory Visit: Payer: Self-pay

## 2018-02-04 ENCOUNTER — Encounter
Admission: RE | Admit: 2018-02-04 | Discharge: 2018-02-04 | Disposition: A | Discharge status: Home / Self Care | Payer: BC Managed Care – PPO | Source: Ambulatory Visit | Attending: General Surgery | Admitting: General Surgery

## 2018-02-04 ENCOUNTER — Emergency Department
Admission: EM | Admit: 2018-02-04 | Discharge: 2018-02-04 | Disposition: A | Discharge status: Home / Self Care | Payer: BC Managed Care – PPO | Attending: Emergency Medicine | Admitting: Emergency Medicine

## 2018-02-04 DIAGNOSIS — R109 Unspecified abdominal pain: Secondary | ICD-10-CM | POA: Insufficient documentation

## 2018-02-04 DIAGNOSIS — Z5321 Procedure and treatment not carried out due to patient leaving prior to being seen by health care provider: Secondary | ICD-10-CM | POA: Insufficient documentation

## 2018-02-04 HISTORY — DX: Unspecified abdominal hernia without obstruction or gangrene: K46.9

## 2018-02-04 LAB — COMPREHENSIVE METABOLIC PANEL
ALBUMIN: 3.9 g/dL (ref 3.5–5.0)
ALK PHOS: 26 U/L — AB (ref 38–126)
ALT: 14 U/L (ref 0–44)
AST: 17 U/L (ref 15–41)
Anion gap: 7 (ref 5–15)
BUN: 13 mg/dL (ref 6–20)
CALCIUM: 8.7 mg/dL — AB (ref 8.9–10.3)
CO2: 23 mmol/L (ref 22–32)
CREATININE: 0.68 mg/dL (ref 0.44–1.00)
Chloride: 108 mmol/L (ref 98–111)
GFR calc non Af Amer: >60 mL/min (ref >60)
GLUCOSE: 75 mg/dL (ref 70–99)
Potassium: 3.5 mmol/L (ref 3.5–5.1)
Sodium: 138 mmol/L (ref 135–145)
Total Bilirubin: 0.7 mg/dL (ref 0.3–1.2)
Total Protein: 5.9 g/dL — ABNORMAL LOW (ref 6.5–8.1)

## 2018-02-04 LAB — URINALYSIS, COMPLETE (UACMP) WITH MICROSCOPIC
Bilirubin Urine: NEGATIVE
Glucose, UA: NEGATIVE mg/dL
Hgb urine dipstick: NEGATIVE
Ketones, ur: NEGATIVE mg/dL
Leukocytes, UA: NEGATIVE
Nitrite: NEGATIVE
PH: 7 (ref 5.0–8.0)
PROTEIN: NEGATIVE mg/dL
SPECIFIC GRAVITY, URINE: 1.002 — AB (ref 1.005–1.030)

## 2018-02-04 LAB — CBC
HEMATOCRIT: 36.9 % (ref 36.0–46.0)
HEMOGLOBIN: 12.7 g/dL (ref 12.0–15.0)
MCH: 31.4 pg (ref 26.0–34.0)
MCHC: 34.4 g/dL (ref 30.0–36.0)
MCV: 91.3 fL (ref 80.0–100.0)
Platelets: 287 10*3/uL (ref 150–400)
RBC: 4.04 MIL/uL (ref 3.87–5.11)
RDW: 12.1 % (ref 11.5–15.5)
WBC: 12.2 10*3/uL — ABNORMAL HIGH (ref 4.0–10.5)
nRBC: 0 % (ref 0.0–0.2)

## 2018-02-04 NOTE — Patient Instructions (Signed)
Your procedure is scheduled on:  02/07/18 Fri Report to Same Day Surgery 2nd floor medical mall [facility] Entrance-take elevator on left to 2nd floor.  Check in with surgery information desk.) To find out your arrival time please call [phone] between 1PM - 3PM on 02/06/18 Thurs  Remember: Instructions that are not followed completely may result in serious medical risk, up to and including death, or upon the discretion of your surgeon and anesthesiologist your surgery may need to be rescheduled.    _x___ 1. Do not eat food after midnight the night before your procedure. You may drink clear liquids up to 2 hours before you are scheduled to arrive at the hospital for your procedure.  Do not drink clear liquids within 2 hours of your scheduled arrival to the hospital.  Clear liquids include  --Water or Apple juice without pulp  --Clear carbohydrate beverage such as ClearFast or Gatorade  --Black Coffee or Clear Tea (No milk, no creamers, do not add anything to                  the coffee or Tea Type 1 and type 2 diabetics should only drink water.   ____Ensure clear carbohydrate drink on the way to the hospital for bariatric patients  ____Ensure clear carbohydrate drink 3 hours before surgery for Dr Dwyane Luo patients if physician instructed.   No gum chewing or hard candies.     __x__ 2. No Alcohol for 24 hours before or after surgery.   __x__3. No Smoking or e-cigarettes for 24 prior to surgery.  Do not use any chewable tobacco products for at least 6 hour prior to surgery   ____  4. Bring all medications with you on the day of surgery if instructed.    __x__ 5. Notify your doctor if there is any change in your medical condition     (cold, fever, infections).    x___6. On the morning of surgery brush your teeth with toothpaste and water.  You may rinse your mouth with mouth wash if you wish.  Do not swallow any toothpaste or mouthwash.   Do not wear jewelry, make-up,  hairpins, clips or nail polish.  Do not wear lotions, powders, or perfumes. You may wear deodorant.  Do not shave 48 hours prior to surgery. Men may shave face and neck.  Do not bring valuables to the hospital.    [facility] is not responsible for any belongings or valuables.               Contacts, dentures or bridgework may not be worn into surgery.  Leave your suitcase in the car. After surgery it may be brought to your room.  For patients admitted to the hospital, discharge time is determined by your                       treatment team.  _  Patients discharged the day of surgery will not be allowed to drive home.  You will need someone to drive you home and stay with you the night of your procedure.    Please read over the following fact sheets that you were given:   [facility] Preparing for Surgery and or MRSA Information   _x___ Take anti-hypertensive listed below, cardiac, seizure, asthma,     anti-reflux and psychiatric medicines. These include:  1. FLUoxetine (PROZAC) 20 MG capsule  2.Levonorgestrel-Ethinyl Estradiol (AMETHIA,CAMRESE)   3.  4.  5.  6.  ____Fleets enema or Magnesium Citrate as directed.   _x___ Use CHG Soap or sage wipes as directed on instruction sheet   ____ Use inhalers on the day of surgery and bring to hospital day of surgery  ____ Stop Metformin and Janumet 2 days prior to surgery.    ____ Take 1/2 of usual insulin dose the night before surgery and none on the morning     surgery.   _x___ Follow recommendations from Cardiologist, Pulmonologist or PCP regarding          stopping Aspirin, Coumadin, Plavix ,Eliquis, Effient, or Pradaxa, and Pletal.  X____Stop Anti-inflammatories such as Advil, Aleve, Ibuprofen, Motrin, Naproxen, Naprosyn, Goodies powders or aspirin products. OK to take Tylenol and                          Celebrex.   _x___ Stop supplements until after surgery.  But may continue Vitamin D, Vitamin B,       and  multivitamin.   ____ Bring C-Pap to the hospital.

## 2018-02-04 NOTE — ED Triage Notes (Signed)
Pt in with co LLQ pain that started today while at work with lower back pain. States having surgery Friday morning for inguinal hernia repair. Denies any fever or dysuria.

## 2018-02-06 MED ORDER — CEFAZOLIN SODIUM-DEXTROSE 2-4 GM/100ML-% IV SOLN
2.0000 g | INTRAVENOUS | Status: AC
  Administered 2018-02-07: 2 g via INTRAVENOUS

## 2018-02-07 ENCOUNTER — Encounter
Admission: RE | Disposition: A | Discharge status: Home / Self Care | Payer: Self-pay | Source: Ambulatory Visit | Attending: General Surgery

## 2018-02-07 ENCOUNTER — Other Ambulatory Visit: Payer: Self-pay

## 2018-02-07 ENCOUNTER — Ambulatory Visit
Admission: RE | Admit: 2018-02-07 | Discharge: 2018-02-07 | Disposition: A | Discharge status: Home / Self Care | Payer: BC Managed Care – PPO | Source: Ambulatory Visit | Attending: General Surgery | Admitting: General Surgery

## 2018-02-07 ENCOUNTER — Ambulatory Visit: Payer: BC Managed Care – PPO

## 2018-02-07 DIAGNOSIS — F419 Anxiety disorder, unspecified: Secondary | ICD-10-CM | POA: Insufficient documentation

## 2018-02-07 DIAGNOSIS — N803 Endometriosis of pelvic peritoneum: Secondary | ICD-10-CM | POA: Diagnosis not present

## 2018-02-07 DIAGNOSIS — R1031 Right lower quadrant pain: Secondary | ICD-10-CM | POA: Diagnosis present

## 2018-02-07 DIAGNOSIS — Z79899 Other long term (current) drug therapy: Secondary | ICD-10-CM | POA: Insufficient documentation

## 2018-02-07 DIAGNOSIS — N809 Endometriosis, unspecified: Secondary | ICD-10-CM | POA: Diagnosis not present

## 2018-02-07 DIAGNOSIS — Z793 Long term (current) use of hormonal contraceptives: Secondary | ICD-10-CM | POA: Diagnosis not present

## 2018-02-07 HISTORY — PX: LAPAROSCOPY: SHX197

## 2018-02-07 LAB — POCT I-STAT 4, (NA,K, GLUC, HGB,HCT)
Glucose, Bld: 83 mg/dL (ref 70–99)
HEMATOCRIT: 41 % (ref 36.0–46.0)
Hemoglobin: 13.9 g/dL (ref 12.0–15.0)
Potassium: 3.8 mmol/L (ref 3.5–5.1)
Sodium: 144 mmol/L (ref 135–145)

## 2018-02-07 SURGERY — LAPAROSCOPY, DIAGNOSTIC
Anesthesia: General | Laterality: Right

## 2018-02-07 MED ORDER — GABAPENTIN 300 MG PO CAPS
300.0000 mg | ORAL_CAPSULE | ORAL | Status: AC
  Administered 2018-02-07: 300 mg via ORAL

## 2018-02-07 MED ORDER — DEXAMETHASONE SODIUM PHOSPHATE 10 MG/ML IJ SOLN
INTRAMUSCULAR | Status: DC | PRN
  Administered 2018-02-07: 5 mg via INTRAVENOUS

## 2018-02-07 MED ORDER — FENTANYL CITRATE (PF) 100 MCG/2ML IJ SOLN
INTRAMUSCULAR | Status: AC
  Filled 2018-02-07: qty 2

## 2018-02-07 MED ORDER — MIDAZOLAM HCL 2 MG/2ML IJ SOLN
INTRAMUSCULAR | Status: DC | PRN
  Administered 2018-02-07: 2 mg via INTRAVENOUS

## 2018-02-07 MED ORDER — ONDANSETRON HCL 4 MG/2ML IJ SOLN: 4.0000 mg | Freq: Once | INTRAMUSCULAR | Status: DC | PRN

## 2018-02-07 MED ORDER — SUGAMMADEX SODIUM 500 MG/5ML IV SOLN
INTRAVENOUS | Status: DC | PRN
  Administered 2018-02-07: 288.4 mg via INTRAVENOUS

## 2018-02-07 MED ORDER — FAMOTIDINE 20 MG PO TABS
20.0000 mg | ORAL_TABLET | Freq: Once | ORAL | Status: AC
  Administered 2018-02-07: 20 mg via ORAL

## 2018-02-07 MED ORDER — HYDROCODONE-ACETAMINOPHEN 5-325 MG PO TABS: 1.0000 | ORAL_TABLET | ORAL | Status: DC | PRN

## 2018-02-07 MED ORDER — FENTANYL CITRATE (PF) 100 MCG/2ML IJ SOLN
INTRAMUSCULAR | Status: DC | PRN
  Administered 2018-02-07 (×2): 50 ug via INTRAVENOUS

## 2018-02-07 MED ORDER — ROCURONIUM BROMIDE 50 MG/5ML IV SOLN
INTRAVENOUS | Status: AC
  Filled 2018-02-07: qty 1

## 2018-02-07 MED ORDER — FENTANYL CITRATE (PF) 100 MCG/2ML IJ SOLN: 25.0000 ug | INTRAMUSCULAR | Status: DC | PRN

## 2018-02-07 MED ORDER — CEFAZOLIN SODIUM-DEXTROSE 2-4 GM/100ML-% IV SOLN
INTRAVENOUS | Status: AC
  Filled 2018-02-07: qty 100

## 2018-02-07 MED ORDER — CELECOXIB 200 MG PO CAPS
ORAL_CAPSULE | ORAL | Status: AC
  Administered 2018-02-07: 200 mg via ORAL
  Filled 2018-02-07: qty 1

## 2018-02-07 MED ORDER — CELECOXIB 200 MG PO CAPS
200.0000 mg | ORAL_CAPSULE | ORAL | Status: AC
  Administered 2018-02-07: 200 mg via ORAL

## 2018-02-07 MED ORDER — LIDOCAINE HCL (PF) 2 % IJ SOLN
INTRAMUSCULAR | Status: AC
  Filled 2018-02-07: qty 10

## 2018-02-07 MED ORDER — PROPOFOL 10 MG/ML IV BOLUS
INTRAVENOUS | Status: DC | PRN
  Administered 2018-02-07: 150 mg via INTRAVENOUS

## 2018-02-07 MED ORDER — LACTATED RINGERS IV SOLN
INTRAVENOUS | Status: DC
  Administered 2018-02-07: 13:00:00 via INTRAVENOUS

## 2018-02-07 MED ORDER — LIDOCAINE HCL (CARDIAC) PF 100 MG/5ML IV SOSY
PREFILLED_SYRINGE | INTRAVENOUS | Status: DC | PRN
  Administered 2018-02-07: 100 mg via INTRAVENOUS

## 2018-02-07 MED ORDER — ONDANSETRON HCL 4 MG/2ML IJ SOLN
INTRAMUSCULAR | Status: DC | PRN
  Administered 2018-02-07: 4 mg via INTRAVENOUS

## 2018-02-07 MED ORDER — GABAPENTIN 300 MG PO CAPS
ORAL_CAPSULE | ORAL | Status: AC
  Administered 2018-02-07: 300 mg via ORAL
  Filled 2018-02-07: qty 1

## 2018-02-07 MED ORDER — PROPOFOL 500 MG/50ML IV EMUL
INTRAVENOUS | Status: AC
  Filled 2018-02-07: qty 50

## 2018-02-07 MED ORDER — KETOROLAC TROMETHAMINE 30 MG/ML IJ SOLN
INTRAMUSCULAR | Status: DC | PRN
  Administered 2018-02-07: 30 mg via INTRAVENOUS

## 2018-02-07 MED ORDER — ROCURONIUM BROMIDE 100 MG/10ML IV SOLN
INTRAVENOUS | Status: DC | PRN
  Administered 2018-02-07: 40 mg via INTRAVENOUS

## 2018-02-07 MED ORDER — MIDAZOLAM HCL 2 MG/2ML IJ SOLN
INTRAMUSCULAR | Status: AC
  Filled 2018-02-07: qty 2

## 2018-02-07 MED ORDER — FAMOTIDINE 20 MG PO TABS
ORAL_TABLET | ORAL | Status: AC
  Administered 2018-02-07: 20 mg via ORAL
  Filled 2018-02-07: qty 1

## 2018-02-07 MED ORDER — EPHEDRINE SULFATE 50 MG/ML IJ SOLN
INTRAMUSCULAR | Status: AC
  Filled 2018-02-07: qty 1

## 2018-02-07 SURGICAL SUPPLY — 48 items
BAG COUNTER SPONGE EZ (MISCELLANEOUS) IMPLANT
BLADE SURG 11 STRL SS SAFETY (MISCELLANEOUS) ×4 IMPLANT
BLADE SURG 15 STRL SS SAFETY (BLADE) ×8 IMPLANT
CANISTER SUCT 1200ML W/VALVE (MISCELLANEOUS) ×4 IMPLANT
CANNULA DILATOR  5MM W/SLV (CANNULA) ×1
CANNULA DILATOR 10 W/SLV (CANNULA) ×3 IMPLANT
CANNULA DILATOR 10MM W/SLV (CANNULA) ×1
CANNULA DILATOR 5 W/SLV (CANNULA) ×3 IMPLANT
CHLORAPREP W/TINT 26ML (MISCELLANEOUS) ×4 IMPLANT
CLOSURE WOUND 1/2 X4 (GAUZE/BANDAGES/DRESSINGS) ×1
COUNTER SPONGE BAG EZ (MISCELLANEOUS)
COVER WAND RF STERILE (DRAPES) ×4 IMPLANT
DECANTER SPIKE VIAL GLASS SM (MISCELLANEOUS) ×4 IMPLANT
DRAIN PENROSE 1/4X12 LTX (DRAIN) ×4 IMPLANT
DRAPE LAPAROTOMY 100X77 ABD (DRAPES) ×4 IMPLANT
DRSG TEGADERM 2-3/8X2-3/4 SM (GAUZE/BANDAGES/DRESSINGS) ×12 IMPLANT
DRSG TEGADERM 4X4.75 (GAUZE/BANDAGES/DRESSINGS) ×4 IMPLANT
DRSG TELFA 4X3 1S NADH ST (GAUZE/BANDAGES/DRESSINGS) ×4 IMPLANT
ELECT REM PT RETURN 9FT ADLT (ELECTROSURGICAL) ×4
ELECTRODE REM PT RTRN 9FT ADLT (ELECTROSURGICAL) ×2 IMPLANT
GLOVE BIO SURGEON STRL SZ7.5 (GLOVE) ×4 IMPLANT
GLOVE INDICATOR 8.0 STRL GRN (GLOVE) ×4 IMPLANT
GOWN STRL REUS W/ TWL LRG LVL3 (GOWN DISPOSABLE) ×4 IMPLANT
GOWN STRL REUS W/TWL LRG LVL3 (GOWN DISPOSABLE) ×4
KIT TURNOVER KIT A (KITS) ×4 IMPLANT
LABEL OR SOLS (LABEL) ×4 IMPLANT
NEEDLE HYPO 22GX1.5 SAFETY (NEEDLE) ×8 IMPLANT
NS IRRIG 500ML POUR BTL (IV SOLUTION) ×4 IMPLANT
PACK BASIN MINOR ARMC (MISCELLANEOUS) ×4 IMPLANT
PACK LAP CHOLECYSTECTOMY (MISCELLANEOUS) ×4 IMPLANT
POUCH ENDO CATCH 10MM SPEC (MISCELLANEOUS) IMPLANT
SEAL FOR SCOPE WARMER C3101 (MISCELLANEOUS) ×4 IMPLANT
STRIP CLOSURE SKIN 1/2X4 (GAUZE/BANDAGES/DRESSINGS) ×3 IMPLANT
SUT PDS AB 0 CT1 27 (SUTURE) ×4 IMPLANT
SUT SURGILON 0 BLK (SUTURE) ×8 IMPLANT
SUT VIC AB 0 SH 27 (SUTURE) ×4 IMPLANT
SUT VIC AB 2-0 SH 27 (SUTURE) ×2
SUT VIC AB 2-0 SH 27XBRD (SUTURE) ×2 IMPLANT
SUT VIC AB 3-0 54X BRD REEL (SUTURE) ×2 IMPLANT
SUT VIC AB 3-0 BRD 54 (SUTURE) ×2
SUT VIC AB 3-0 SH 27 (SUTURE) ×2
SUT VIC AB 3-0 SH 27X BRD (SUTURE) ×2 IMPLANT
SUT VIC AB 4-0 FS2 27 (SUTURE) ×4 IMPLANT
SWABSTK COMLB BENZOIN TINCTURE (MISCELLANEOUS) ×4 IMPLANT
SYR 10ML LL (SYRINGE) ×4 IMPLANT
SYR 3ML LL SCALE MARK (SYRINGE) ×4 IMPLANT
TRAY FOLEY MTR SLVR 16FR STAT (SET/KITS/TRAYS/PACK) IMPLANT
TUBING INSUFFLATION (TUBING) ×4 IMPLANT

## 2018-02-07 NOTE — Anesthesia Postprocedure Evaluation (Signed)
Anesthesia Post Note  Patient: Shelly Middleton  Procedure(s) Performed: LAPAROSCOPY DIAGNOSTIC (N/A )  Patient location during evaluation: PACU Anesthesia Type: General Level of consciousness: awake and alert Pain management: pain level controlled Vital Signs Assessment: post-procedure vital signs reviewed and stable Respiratory status: spontaneous breathing and respiratory function stable Cardiovascular status: stable Anesthetic complications: no     Last Vitals:  Vitals:   02/07/18 1514 02/07/18 1536  BP: 138/68 129/76  Pulse: 77 84  Resp: 14 16  Temp: 36.8 C   SpO2: 99% 98%    Last Pain:  Vitals:   02/07/18 1536  TempSrc:   PainSc: 0-No pain                 KEPHART,WILLIAM K

## 2018-02-07 NOTE — Anesthesia Procedure Notes (Signed)
Procedure Name: Intubation Date/Time: 02/07/2018 1:37 PM Performed by: Gunnar Fusi, MD Pre-anesthesia Checklist: Patient identified, Emergency Drugs available, Suction available, Patient being monitored and Timeout performed Patient Re-evaluated:Patient Re-evaluated prior to induction Oxygen Delivery Method: Circle system utilized Preoxygenation: Pre-oxygenation with 100% oxygen Induction Type: IV induction Ventilation: Mask ventilation without difficulty Laryngoscope Size: Mac and 3 Grade View: Grade II Tube type: Oral Tube size: 7.0 mm Number of attempts: 1 Airway Equipment and Method: Stylet Placement Confirmation: ETT inserted through vocal cords under direct vision,  positive ETCO2 and breath sounds checked- equal and bilateral Secured at: 20 cm Tube secured with: Tape Dental Injury: Teeth and Oropharynx as per pre-operative assessment

## 2018-02-07 NOTE — Op Note (Signed)
Preoperative diagnosis: Right lower quadrant pain, possible inguinal hernia.  Postoperative diagnosis: Endometriosis.  Operative procedure: Diagnostic laparoscopy.  Operating Surgeon: Hervey Ard D.  Anesthesia: General endotracheal.  Estimated blood loss: None.  Clinical note: This 41 year old woman developed right lower quadrant pain and there was a suggestion of a possible inguinal hernia.  She had extensive surgery in the past for endometriosis and it was unclear whether hernia was present.  It was elected to perform laparoscopy to assess for the source of her pain.  The patient received preoperative Ancef.  SCD stockings for DVT prevention.  Operative note: With the patient under adequate general endotracheal anesthesia the abdomen was prepped with ChloraPrep and draped.  Through a 5 mm trans-umbilical incision a varies needle was placed.  After assuring intra-abdominal location with a hanging drop test the abdomen was insufflated with CO2 of 10 mmHg pressure.  A 5 mm Step port was expanded.  Inspection showed no evidence of injury from initial port placement.  There were no adhesions in the lower abdomen.  The liver, appendix, right colon were all unremarkable.  Examination of the anterior abdominal wall showed no evidence of inguinal hernia.  There were 2 less than 5 mm brown spots consistent with endometriosis in the right lower abdominal wall peritoneum.  With no other intra-abdominal findings without evidence of inflammatory bowel disease the telescope was removed and the abdomen desufflated through the port.  The skin incision was closed with 4-0 Vicryl subcuticular sutures.  Benzoin and Steri-Strip followed by Telfa and Tegaderm dressing applied.  The patient tolerated the procedure well and was taken to the recovery room in stable condition.

## 2018-02-07 NOTE — Transfer of Care (Signed)
Immediate Anesthesia Transfer of Care Note  Patient: Shelly Middleton  Procedure(s) Performed: LAPAROSCOPY DIAGNOSTIC (N/A )  Patient Location: PACU  Anesthesia Type:General  Level of Consciousness: awake, alert  and oriented  Airway & Oxygen Therapy: Patient connected to face mask oxygen  Post-op Assessment: Post -op Vital signs reviewed and stable  Post vital signs: stable  Last Vitals:  Vitals Value Taken Time  BP 137/88 02/07/2018  2:12 PM  Temp    Pulse 83 02/07/2018  2:13 PM  Resp 13 02/07/2018  2:13 PM  SpO2 98 % 02/07/2018  2:13 PM  Vitals shown include unvalidated device data.  Last Pain:  Vitals:   02/07/18 1319  TempSrc: Tympanic  PainSc: 3          Complications: No apparent anesthesia complications

## 2018-02-07 NOTE — Anesthesia Post-op Follow-up Note (Signed)
Anesthesia QCDR form completed.        

## 2018-02-07 NOTE — H&P (Signed)
No change in clinical history or exam. For laparoscopy, possible hernia repair.

## 2018-02-07 NOTE — Anesthesia Preprocedure Evaluation (Addendum)
Anesthesia Evaluation  Patient identified by MRN, date of birth, ID band Patient awake    Reviewed: Allergy & Precautions, NPO status , Patient's Chart, lab work & pertinent test results  History of Anesthesia Complications Negative for: history of anesthetic complications  Airway Mallampati: II       Dental   Pulmonary neg sleep apnea, neg COPD,           Cardiovascular (-) hypertension(-) Past MI and (-) CHF (-) dysrhythmias (-) Valvular Problems/Murmurs     Neuro/Psych neg Seizures Anxiety    GI/Hepatic Neg liver ROS, neg GERD  ,  Endo/Other  neg diabetes  Renal/GU negative Renal ROS     Musculoskeletal   Abdominal   Peds  Hematology   Anesthesia Other Findings   Reproductive/Obstetrics                            Anesthesia Physical Anesthesia Plan  ASA: II  Anesthesia Plan: General   Post-op Pain Management:    Induction:   PONV Risk Score and Plan: 3 and Dexamethasone, Ondansetron and Midazolam  Airway Management Planned: Oral ETT  Additional Equipment:   Intra-op Plan:   Post-operative Plan:   Informed Consent: I have reviewed the patients History and Physical, chart, labs and discussed the procedure including the risks, benefits and alternatives for the proposed anesthesia with the patient or authorized representative who has indicated his/her understanding and acceptance.     Plan Discussed with:   Anesthesia Plan Comments:         Anesthesia Quick Evaluation

## 2018-02-07 NOTE — Discharge Instructions (Signed)
AMBULATORY SURGERY  °DISCHARGE INSTRUCTIONS ° ° °1) The drugs that you were given will stay in your system until tomorrow so for the next 24 hours you should not: ° °A) Drive an automobile °B) Make any legal decisions °C) Drink any alcoholic beverage ° ° °2) You may resume regular meals tomorrow.  Today it is better to start with liquids and gradually work up to solid foods. ° °You may eat anything you prefer, but it is better to start with liquids, then soup and crackers, and gradually work up to solid foods. ° ° °3) Please notify your doctor immediately if you have any unusual bleeding, trouble breathing, redness and pain at the surgery site, drainage, fever, or pain not relieved by medication. ° °Please contact your physician with any problems or Same Day Surgery at [phone], Monday through Friday 6 am to 4 pm, or [place] at [address] Main number at [phone]. °

## 2018-02-10 ENCOUNTER — Encounter: Payer: Self-pay | Admitting: General Surgery

## 2018-02-13 ENCOUNTER — Other Ambulatory Visit: Payer: Self-pay

## 2018-02-13 ENCOUNTER — Ambulatory Visit (INDEPENDENT_AMBULATORY_CARE_PROVIDER_SITE_OTHER): Payer: BC Managed Care – PPO | Admitting: General Surgery

## 2018-02-13 ENCOUNTER — Encounter: Payer: Self-pay | Admitting: General Surgery

## 2018-02-13 VITALS — BP 153/91 | HR 75 | Temp 97.5°F | Resp 18 | Ht 66.0 in | Wt 161.6 lb

## 2018-02-13 DIAGNOSIS — N809 Endometriosis, unspecified: Secondary | ICD-10-CM

## 2018-02-13 NOTE — Progress Notes (Signed)
Patient ID: Shelly Middleton, female   DOB: February 02, 1976, 42 y.o.   MRN: [id number]  Chief Complaint  Patient presents with  . Routine Post Op     post op LAPAROSCOPY DIAGNOSTIC  02/07/18    HPI Shelly Middleton is a 42 y.o. female here today to follow up for post op diagnostic laparoscopy.  Patient states she is feeling well.  HPI  Past Medical History:  Diagnosis Date  . Anxiety   . Endometriosis   . Hernia, abdominal     Past Surgical History:  Procedure Laterality Date  . BUNIONECTOMY Bilateral 2002 (left), 2009 (right)  . COLONOSCOPY WITH PROPOFOL N/A 11/02/2016   Procedure: COLONOSCOPY WITH PROPOFOL;  Surgeon: Lucilla Lame, MD;  Location: [facility] ENDOSCOPY;  Service: Endoscopy;  Laterality: N/A;  . LAPAROSCOPIC VAGINAL HYSTERECTOMY WITH SALPINGO OOPHORECTOMY Bilateral 03/11/2017   Procedure: LAPAROSCOPIC ASSISTED VAGINAL HYSTERECTOMY WITH BILATERAL OOPHERECTOMY;  Surgeon: Harlin Heys, MD;  Location: [facility] ORS;  Service: Gynecology;  Laterality: Bilateral;  . LAPAROSCOPY N/A 02/07/2018   Procedure: LAPAROSCOPY DIAGNOSTIC;  Surgeon: Robert Bellow, MD;  Location: [facility] ORS;  Service: General;  Laterality: N/A;  . OOPHORECTOMY      Family History  Problem Relation Age of Onset  . Hypertension Mother   . Endometriosis Mother   . Hypertension Father   . CAD Father   . Breast cancer Maternal Grandmother 78       and possibly 1 or 2 of grandmother's sisters  . Breast cancer Maternal Aunt     Social History Social History   Tobacco Use  . Smoking status: Never Smoker  . Smokeless tobacco: Never Used  Substance Use Topics  . Alcohol use: Yes    Frequency: Never    Comment: occ  . Drug use: No    Allergies  Allergen Reactions  . Tape Rash    Reaction only happens on feet, the rest of the body is ok    Current Outpatient Medications  Medication Sig Dispense Refill  . FLUoxetine (PROZAC) 20 MG capsule Take 20 mg by mouth every morning.   3  . ibuprofen  (ADVIL,MOTRIN) 600 MG tablet Take 600 mg by mouth 2 (two) times daily.     . Levonorgestrel-Ethinyl Estradiol (AMETHIA,CAMRESE) 0.15-0.03 &0.01 MG tablet Take 1 tablet by mouth daily. 1 Package 4   No current facility-administered medications for this visit.     Review of Systems Review of Systems  Constitutional: Negative.   Respiratory: Negative.   Cardiovascular: Negative.     Blood pressure (!) 153/91, pulse 75, temperature (!) 97.5 F (36.4 C), temperature source Temporal, resp. rate 18, height 5\' 6"  (1.676 m), weight 161 lb 9.6 oz (73.3 kg), last menstrual period 03/04/2017, SpO2 96 %.  Physical Exam Physical Exam Constitutional:      Appearance: She is well-developed.  Eyes:     General: No scleral icterus.    Conjunctiva/sclera: Conjunctivae normal.  Cardiovascular:     Rate and Rhythm: Normal rate and regular rhythm.     Heart sounds: Normal heart sounds.  Pulmonary:     Effort: Pulmonary effort is normal.     Breath sounds: Normal breath sounds.  Lymphadenopathy:     Cervical: No cervical adenopathy.  Skin:    General: Skin is warm and dry.  Neurological:     Mental Status: She is alert and oriented to person, place, and time.     Data Reviewed     Assessment    No evidence  of right inguinal hernia.  2 small 5 mm foci of suspected endometriosis on the lower right abdominal wall in the area of her symptomatic discomfort.    Plan Patient is to return to the office as needed, follow up with Jeannie Fend, MD from GYN.  Activity as tolerated.   HPI, Physical Exam, Assessment and Plan have been scribed under the direction and in the presence of Hervey Ard, Md.  Eudelia Bunch R. Bobette Mo, CMA  I have completed the exam and reviewed the above documentation for accuracy and completeness.  I agree with the above.  Haematologist has been used and any errors in dictation or transcription are unintentional.  Hervey Ard, M.D., F.A.C.S.   Shelly Middleton  Shelly Middleton 02/14/2018, 6:54 AM

## 2018-02-13 NOTE — Patient Instructions (Addendum)
Patient is to return to the office as needed, follow up with your GYN.  Call the office with any questions or concerns.

## 2018-02-27 ENCOUNTER — Encounter: Payer: Self-pay | Admitting: Obstetrics and Gynecology

## 2018-02-27 ENCOUNTER — Ambulatory Visit: Payer: BC Managed Care – PPO | Admitting: Obstetrics and Gynecology

## 2018-02-27 VITALS — BP 111/82 | HR 76 | Ht 63.0 in | Wt 160.9 lb

## 2018-02-27 DIAGNOSIS — N809 Endometriosis, unspecified: Secondary | ICD-10-CM | POA: Diagnosis not present

## 2018-02-27 DIAGNOSIS — R102 Pelvic and perineal pain: Secondary | ICD-10-CM | POA: Diagnosis not present

## 2018-02-27 MED ORDER — ESTRADIOL 1 MG PO TABS: 1.5000 mg | ORAL_TABLET | Freq: Every day | ORAL | 3 refills | Status: DC

## 2018-02-27 NOTE — Progress Notes (Signed)
HPI:      [name] is a 43 y.o. [phone] who LMP was Patient's last menstrual period was 03/04/2017 (exact date).  Subjective:   She presents today for follow-up after her surgery with Dr. Tollie Pizza.  Patient was found to have suspected endometriosis which is consistent with her pain and with her history of endometriosis. Of significant note patient previously had a hysterectomy with bilateral oophorectomy.  She is taking OCPs after failure of lower dose estrogen that did not control her hot flashes.    Hx: The following portions of the patient's history were reviewed and updated as appropriate:             She  has a past medical history of Anxiety, Endometriosis, and Hernia, abdominal. She does not have any pertinent problems on file. She  has a past surgical history that includes Bunionectomy (Bilateral, 2002 (left), 2009 (right)); Colonoscopy with propofol (N/A, 11/02/2016); Laparoscopic vaginal hysterectomy with salpingo oophorectomy (Bilateral, 03/11/2017); Oophorectomy; and laparoscopy (N/A, 02/07/2018). Her family history includes Breast cancer in her maternal aunt; Breast cancer (age of onset: 78) in her maternal grandmother; CAD in her father; Endometriosis in her mother; Hypertension in her father and mother. She  reports that she has never smoked. She has never used smokeless tobacco. She reports current alcohol use. She reports that she does not use drugs. She has a current medication list which includes the following prescription(s): fluoxetine, ibuprofen, levonorgestrel-ethinyl estradiol, and estradiol. She is allergic to tape.       Review of Systems:  Review of Systems  Constitutional: Denied constitutional symptoms, night sweats, recent illness, fatigue, fever, insomnia and weight loss.  Eyes: Denied eye symptoms, eye pain, photophobia, vision change and visual disturbance.  Ears/Nose/Throat/Neck: Denied ear, nose, throat or neck symptoms, hearing loss, nasal discharge,  sinus congestion and sore throat.  Cardiovascular: Denied cardiovascular symptoms, arrhythmia, chest pain/pressure, edema, exercise intolerance, orthopnea and palpitations.  Respiratory: Denied pulmonary symptoms, asthma, pleuritic pain, productive sputum, cough, dyspnea and wheezing.  Gastrointestinal: Denied, gastro-esophageal reflux, melena, nausea and vomiting.  Genitourinary: Denied genitourinary symptoms including symptomatic vaginal discharge, pelvic relaxation issues, and urinary complaints.  Musculoskeletal: Denied musculoskeletal symptoms, stiffness, swelling, muscle weakness and myalgia.  Dermatologic: Denied dermatology symptoms, rash and scar.  Neurologic: Denied neurology symptoms, dizziness, headache, neck pain and syncope.  Psychiatric: Denied psychiatric symptoms, anxiety and depression.  Endocrine: Denied endocrine symptoms including hot flashes and night sweats.   Meds:   Current Outpatient Medications on File Prior to Visit  Medication Sig Dispense Refill  . FLUoxetine (PROZAC) 20 MG capsule Take 20 mg by mouth every morning.   3  . ibuprofen (ADVIL,MOTRIN) 600 MG tablet Take 600 mg by mouth 2 (two) times daily.     . Levonorgestrel-Ethinyl Estradiol (AMETHIA,CAMRESE) 0.15-0.03 &0.01 MG tablet Take 1 tablet by mouth daily. 1 Package 4   No current facility-administered medications on file prior to visit.     Objective:     Vitals:   02/27/18 1032  BP: 111/82  Pulse: 76                Assessment:    [id number] Patient Active Problem List   Diagnosis Date Noted  . Right groin pain 01/30/2018  . Post-operative state 03/11/2017  . Abnormal CT scan, colon   . Abnormal abdominal CT scan   . Abdominal pain 10/31/2016  . Abdominal pain, RLQ      1. Pelvic pain in female   2. Endometriosis determined  by laparoscopy     It is likely that she has a continuation of her endometriosis and that it is supported by her OCPs.    Plan:            1.  3 options  were discussed with her.  Option 1 was to continue the OCPs with the expectation that over time the endometriosis will burn itself out and there is no new source of endometrium.  She did not like this option.  Option 2 was 2 go off of the OCPs for 3 to 6 months and allow hypo-estrogen condition to burnout the endometriosis.  Understanding that she would have hot flashes she declined this option.  Option 3 is to decrease her estrogen dose by giving menopausal doses of estrogen but not enough to stimulate the endometriosis.  She understands that she may have some hot flashes as she did postop but the trade off of pain versus hot flashes seems to be reasonable.  She has chosen this option and will start 0.9 of Premarin daily.  She will discontinue her OCPs.  Orders No orders of the defined types were placed in this encounter.    Meds ordered this encounter  Medications  . estradiol (ESTRACE) 1 MG tablet    Sig: Take 1.5 tablets (1.5 mg total) by mouth daily.    Dispense:  135 tablet    Refill:  3      F/U  Return in about 3 months (around 05/29/2018). I spent 20 minutes involved in the care of this patient of which greater than 50% was spent discussing endometriosis, multiple medical options for treatment.  Risk benefits of each option.  All questions answered.  Finis Bud, M.D. 02/27/2018 10:58 AM

## 2018-02-27 NOTE — Progress Notes (Signed)
Patient comes in today for follow up for her endometriosis after seeing Dr. Bary Castilla.

## 2018-06-10 ENCOUNTER — Encounter: Payer: Self-pay | Admitting: Obstetrics and Gynecology

## 2018-07-16 ENCOUNTER — Encounter: Payer: BC Managed Care – PPO | Admitting: Obstetrics and Gynecology

## 2018-08-14 ENCOUNTER — Other Ambulatory Visit: Payer: Self-pay | Admitting: Family Medicine

## 2018-08-14 DIAGNOSIS — Z1231 Encounter for screening mammogram for malignant neoplasm of breast: Secondary | ICD-10-CM

## 2018-09-23 ENCOUNTER — Ambulatory Visit
Admission: RE | Admit: 2018-09-23 | Discharge: 2018-09-23 | Disposition: A | Discharge status: Home / Self Care | Payer: BC Managed Care – PPO | Source: Ambulatory Visit | Attending: Family Medicine | Admitting: Family Medicine

## 2018-09-23 DIAGNOSIS — Z1231 Encounter for screening mammogram for malignant neoplasm of breast: Secondary | ICD-10-CM | POA: Insufficient documentation

## 2020-02-05 ENCOUNTER — Other Ambulatory Visit: Payer: Self-pay | Admitting: Family Medicine

## 2020-02-05 DIAGNOSIS — Z1231 Encounter for screening mammogram for malignant neoplasm of breast: Secondary | ICD-10-CM

## 2020-05-24 ENCOUNTER — Ambulatory Visit
Admission: RE | Admit: 2020-05-24 | Discharge: 2020-05-24 | Disposition: A | Discharge status: Home / Self Care | Payer: 59 | Source: Ambulatory Visit | Attending: Family Medicine | Admitting: Family Medicine

## 2020-05-24 ENCOUNTER — Other Ambulatory Visit: Payer: Self-pay

## 2020-05-24 DIAGNOSIS — Z1231 Encounter for screening mammogram for malignant neoplasm of breast: Secondary | ICD-10-CM | POA: Diagnosis present

## 2020-05-25 DIAGNOSIS — F419 Anxiety disorder, unspecified: Secondary | ICD-10-CM

## 2020-05-25 HISTORY — DX: Anxiety disorder, unspecified: F41.9

## 2020-05-26 ENCOUNTER — Emergency Department: Payer: 59

## 2020-05-26 ENCOUNTER — Emergency Department
Admission: EM | Admit: 2020-05-26 | Discharge: 2020-05-26 | Disposition: A | Discharge status: Home / Self Care | Payer: 59 | Attending: Emergency Medicine | Admitting: Emergency Medicine

## 2020-05-26 ENCOUNTER — Other Ambulatory Visit: Payer: Self-pay

## 2020-05-26 DIAGNOSIS — D72829 Elevated white blood cell count, unspecified: Secondary | ICD-10-CM | POA: Insufficient documentation

## 2020-05-26 DIAGNOSIS — R569 Unspecified convulsions: Secondary | ICD-10-CM | POA: Diagnosis present

## 2020-05-26 LAB — BASIC METABOLIC PANEL
Anion gap: 11 (ref 5–15)
BUN: 12 mg/dL (ref 6–20)
CO2: 21 mmol/L — ABNORMAL LOW (ref 22–32)
Calcium: 8.6 mg/dL — ABNORMAL LOW (ref 8.9–10.3)
Chloride: 106 mmol/L (ref 98–111)
Creatinine, Ser: 0.65 mg/dL (ref 0.44–1.00)
GFR, Estimated: >60 mL/min (ref >60)
Glucose, Bld: 200 mg/dL — ABNORMAL HIGH (ref 70–99)
Potassium: 3.2 mmol/L — ABNORMAL LOW (ref 3.5–5.1)
Sodium: 138 mmol/L (ref 135–145)

## 2020-05-26 LAB — CBC
HCT: 47.7 % — ABNORMAL HIGH (ref 36.0–46.0)
Hemoglobin: 17.4 g/dL — ABNORMAL HIGH (ref 12.0–15.0)
MCH: 31.3 pg (ref 26.0–34.0)
MCHC: 36.5 g/dL — ABNORMAL HIGH (ref 30.0–36.0)
MCV: 85.8 fL (ref 80.0–100.0)
Platelets: 323 10*3/uL (ref 150–400)
RBC: 5.56 MIL/uL — ABNORMAL HIGH (ref 3.87–5.11)
RDW: 12 % (ref 11.5–15.5)
WBC: 16.4 10*3/uL — ABNORMAL HIGH (ref 4.0–10.5)
nRBC: 0 % (ref 0.0–0.2)

## 2020-05-26 LAB — CBG MONITORING, ED: Glucose-Capillary: 181 mg/dL — ABNORMAL HIGH (ref 70–99)

## 2020-05-26 MED ORDER — ONDANSETRON HCL 4 MG/2ML IJ SOLN
INTRAMUSCULAR | Status: AC
  Filled 2020-05-26: qty 2

## 2020-05-26 MED ORDER — ONDANSETRON HCL 4 MG/2ML IJ SOLN
4.0000 mg | Freq: Once | INTRAMUSCULAR | Status: AC
  Administered 2020-05-26: 4 mg via INTRAVENOUS

## 2020-05-26 NOTE — ED Notes (Signed)
Notified CT that pt can receive CT without urine pregnancy due to history of total hysterectomy, MD is aware of surgical history also.

## 2020-05-26 NOTE — ED Triage Notes (Signed)
BIB ems. Patient picked up at plasma place in [place] fro a witnessed seizure by the staff at the plasma place. Lasted 30-45 seconds just jerking like motions. Patient has no chronic medical history. She did start taking new medication as of yesterday for anxiety named effexor. EMS VS 116/82 HR 72

## 2020-05-26 NOTE — Discharge Instructions (Addendum)
Discussed continuation of the Effexor with your primary care provider.  You should not take the Effexor again until you talk to the primary.  Return to the ER for new, worsening, or recurrent seizure-like activity, convulsions, shaking, recurrent episodes of passing out, or any other new or worsening symptoms that concern you.

## 2020-05-26 NOTE — ED Provider Notes (Signed)
[facility] Emergency Department Provider Note ____________________________________________   Event Date/Time   First MD Initiated Contact with Patient 05/26/20 1203     (approximate)  I have reviewed the triage vital signs and the nursing notes.   HISTORY  Chief Complaint Seizures    HPI Shelly Middleton is a 45 y.o. female with PMH as noted below who presents with an apparent seizure-like episode, acute onset this morning when she was donating plasma.  The patient states that she felt lightheaded or weak for short time and then lost consciousness.  Bystanders reported that she had generalized seizure-like activity and was foaming at the mouth.  The patient did not bite her tongue or have any incontinence.  She states that she feels tired but otherwise well.  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  She states she has been feeling well over the last few days.  She did start Effexor yesterday for anxiety and took a dose yesterday but not today.  She has no other recent medication changes.  Past Medical History:  Diagnosis Date  . Anxiety   . Anxiety 05/25/2020  . Endometriosis   . Hernia, abdominal     Patient Active Problem List   Diagnosis Date Noted  . Right groin pain 01/30/2018  . Post-operative state 03/11/2017  . Abnormal CT scan, colon   . Abnormal abdominal CT scan   . Abdominal pain 10/31/2016  . Abdominal pain, RLQ     Past Surgical History:  Procedure Laterality Date  . ABDOMINAL HYSTERECTOMY    . BUNIONECTOMY Bilateral 2002 (left), 2009 (right)  . COLONOSCOPY WITH PROPOFOL N/A 11/02/2016   Procedure: COLONOSCOPY WITH PROPOFOL;  Surgeon: Lucilla Lame, MD;  Location: [facility] ENDOSCOPY;  Service: Endoscopy;  Laterality: N/A;  . LAPAROSCOPIC VAGINAL HYSTERECTOMY WITH SALPINGO OOPHORECTOMY Bilateral 03/11/2017   Procedure: LAPAROSCOPIC ASSISTED VAGINAL HYSTERECTOMY WITH BILATERAL OOPHERECTOMY;  Surgeon: Harlin Heys, MD;  Location: [facility] ORS;  Service: Gynecology;  Laterality: Bilateral;  . LAPAROSCOPY N/A 02/07/2018   Procedure: LAPAROSCOPY DIAGNOSTIC;  Surgeon: Robert Bellow, MD;  Location: [facility] ORS;  Service: General;  Laterality: N/A;  . OOPHORECTOMY      Prior to Admission medications   Medication Sig Start Date End Date Taking? Authorizing Provider  estradiol (ESTRACE) 1 MG tablet Take 1.5 tablets (1.5 mg total) by mouth daily. 02/27/18 02/27/19  Harlin Heys, MD  FLUoxetine (PROZAC) 20 MG capsule Take 20 mg by mouth every morning.  01/07/17   [provider]  ibuprofen (ADVIL,MOTRIN) 600 MG tablet Take 600 mg by mouth 2 (two) times daily.     [provider]  Levonorgestrel-Ethinyl Estradiol (AMETHIA,CAMRESE) 0.15-0.03 &0.01 MG tablet Take 1 tablet by mouth daily. 10/21/17   Harlin Heys, MD    Allergies Tape  Family History  Problem Relation Age of Onset  . Hypertension Mother   . Endometriosis Mother   . Hypertension Father   . CAD Father   . Breast cancer Maternal Grandmother 78       and possibly 1 or 2 of grandmother's sisters  . Breast cancer Maternal Aunt     Social History Social History   Tobacco Use  . Smoking status: Never Smoker  . Smokeless tobacco: Never Used  Vaping Use  . Vaping Use: Never used  Substance Use Topics  . Alcohol use: Yes    Comment: occ  . Drug use: No    Review of Systems  Constitutional: No fever.  Eyes: No visual changes. ENT: No sore throat. Cardiovascular: Denies chest pain. Respiratory: Denies shortness of breath. Gastrointestinal: No vomiting or diarrhea Genitourinary: Negative for dysuria.  Musculoskeletal: Negative for back pain. Skin: Negative for rash. Neurological: Negative for headaches, focal weakness or numbness.   ____________________________________________   PHYSICAL EXAM:  VITAL SIGNS: ED Triage Vitals  Enc Vitals Group     BP 05/26/20 1127 116/86     Pulse Rate 05/26/20  1127 70     Resp 05/26/20 1127 16     Temp --      Temp src --      SpO2 05/26/20 1127 96 %     Weight 05/26/20 1128 152 lb (68.9 kg)     Height 05/26/20 1128 5\' 3"  (1.6 m)     Head Circumference --      Peak Flow --      Pain Score 05/26/20 1127 0     Pain Loc --      Pain Edu? --      Excl. in [place]? --     Constitutional: Alert and oriented. Well appearing and in no acute distress. Eyes: Conjunctivae are normal.  EOMI.  PERRLA.  Mild horizontal nystagmus. Head: Atraumatic. Nose: No congestion/rhinnorhea. Mouth/Throat: Mucous membranes are moist.   Neck: Normal range of motion.  Cardiovascular: Normal rate, regular rhythm. Good peripheral circulation. Respiratory: Normal respiratory effort.  No retractions.  Gastrointestinal: No distention.  Musculoskeletal: Extremities warm and well perfused.  Neurologic:  Normal speech and language.  Motor and sensory intact in all extremities.  No facial droop.  Normal coordination, no ataxia.  No pronator drift.  Skin:  Skin is warm and dry. No rash noted. Psychiatric: Mood and affect are normal. Speech and behavior are normal.  ____________________________________________   LABS (all labs ordered are listed, but only abnormal results are displayed)  Labs Reviewed  BASIC METABOLIC PANEL - Abnormal; Notable for the following components:      Result Value   Potassium 3.2 (*)    CO2 21 (*)    Glucose, Bld 200 (*)    Calcium 8.6 (*)    All other components within normal limits  CBC - Abnormal; Notable for the following components:   WBC 16.4 (*)    RBC 5.56 (*)    Hemoglobin 17.4 (*)    HCT 47.7 (*)    MCHC 36.5 (*)    All other components within normal limits  CBG MONITORING, ED - Abnormal; Notable for the following components:   Glucose-Capillary 181 (*)    All other components within normal limits   ____________________________________________  EKG  ED ECG REPORT I, Arta Silence, the attending physician, personally  viewed and interpreted this ECG.  Date: 05/26/2020 EKG Time: 1245 Rate: 74 Rhythm: normal sinus rhythm QRS Axis: normal Intervals: normal ST/T Wave abnormalities: normal Narrative Interpretation: no evidence of acute ischemia  ____________________________________________  RADIOLOGY  CT head: No acute abnormality  ____________________________________________   PROCEDURES  Procedure(s) performed: No  Procedures  Critical Care performed: No ____________________________________________   INITIAL IMPRESSION / ASSESSMENT AND PLAN / ED COURSE  Pertinent labs & imaging results that were available during my care of the patient were reviewed by me and considered in my medical decision making (see chart for details).  46 year old female with PMH as noted above presents with an episode of apparent seizurelike  activity after donating plasma.  She had a short prodrome of lightheadedness or weakness.  She denies any prior history of  seizures.  She was started on Effexor yesterday but denies any other recent medication changes.  On exam, the patient is well-appearing.  Her vital signs are normal.  The physical exam is unremarkable.  Neurologic exam is nonfocal.  Differential includes seizure versus possible syncope with some convulsions.  Seizure is not a common adverse effect of Effexor although it is possible but this could be related.  We will obtain CT head and lab work-up for first-time seizure.  I will start the patient to discontinue the Effexor for now and discuss with her PMD.  ----------------------------------------- 2:56 PM on 05/26/2020 -----------------------------------------  Lab work-up is unremarkable except for slightly elevated hemoglobin and hematocrit similar to prior labs.  The patient has leukocytosis but this is not unexpected after a seizure.  CT head shows no acute abnormality.  The patient has had no further symptoms.  Her vital signs have remained stable.   (Blood pressure is borderline low although this also is not concerning after plasma donation).  The patient has no orthostatic symptoms.  I counseled the patient on the results of the work-up and on follow-up for a first-time possible seizure although I suspect that syncope is more likely.  She will follow up with her PMD and discontinue Effexor until then.  Return precautions given, and she expresses understanding.  ____________________________________________   FINAL CLINICAL IMPRESSION(S) / ED DIAGNOSES  Final diagnoses:  Seizure-like activity ([place])      NEW MEDICATIONS STARTED DURING THIS VISIT:  New Prescriptions   No medications on file     Note:  This document was prepared using Dragon voice recognition software and may include unintentional dictation errors.   Arta Silence, MD 05/26/20 1459

## 2020-05-30 ENCOUNTER — Other Ambulatory Visit: Payer: Self-pay | Admitting: Family Medicine

## 2020-06-01 ENCOUNTER — Other Ambulatory Visit: Payer: Self-pay | Admitting: Family Medicine

## 2020-06-01 DIAGNOSIS — R928 Other abnormal and inconclusive findings on diagnostic imaging of breast: Secondary | ICD-10-CM

## 2020-06-01 DIAGNOSIS — N6489 Other specified disorders of breast: Secondary | ICD-10-CM

## 2020-06-10 ENCOUNTER — Ambulatory Visit
Admission: RE | Admit: 2020-06-10 | Discharge: 2020-06-10 | Disposition: A | Discharge status: Home / Self Care | Payer: 59 | Source: Ambulatory Visit | Attending: Family Medicine | Admitting: Family Medicine

## 2020-06-10 ENCOUNTER — Other Ambulatory Visit: Payer: Self-pay

## 2020-06-10 DIAGNOSIS — R928 Other abnormal and inconclusive findings on diagnostic imaging of breast: Secondary | ICD-10-CM

## 2020-06-10 DIAGNOSIS — N6489 Other specified disorders of breast: Secondary | ICD-10-CM

## 2021-05-24 ENCOUNTER — Other Ambulatory Visit: Payer: Self-pay | Admitting: Family Medicine

## 2021-05-24 DIAGNOSIS — R1032 Left lower quadrant pain: Secondary | ICD-10-CM

## 2021-05-24 DIAGNOSIS — F411 Generalized anxiety disorder: Secondary | ICD-10-CM

## 2021-06-06 ENCOUNTER — Ambulatory Visit: Admission: RE | Admit: 2021-06-06 | Payer: 59 | Source: Ambulatory Visit

## 2021-06-13 ENCOUNTER — Other Ambulatory Visit: Payer: Self-pay

## 2021-06-13 ENCOUNTER — Ambulatory Visit
Admission: RE | Admit: 2021-06-13 | Discharge: 2021-06-13 | Disposition: A | Discharge status: Home / Self Care | Payer: 59 | Source: Ambulatory Visit | Attending: Family Medicine | Admitting: Family Medicine

## 2021-06-13 DIAGNOSIS — F411 Generalized anxiety disorder: Secondary | ICD-10-CM | POA: Insufficient documentation

## 2021-06-13 DIAGNOSIS — R1031 Right lower quadrant pain: Secondary | ICD-10-CM | POA: Insufficient documentation

## 2021-06-13 DIAGNOSIS — R1032 Left lower quadrant pain: Secondary | ICD-10-CM | POA: Insufficient documentation

## 2021-06-13 MED ORDER — IOHEXOL 300 MG/ML  SOLN
100.0000 mL | Freq: Once | INTRAMUSCULAR | Status: AC | PRN
  Administered 2021-06-13: 100 mL via INTRAVENOUS

## 2021-08-31 ENCOUNTER — Other Ambulatory Visit: Payer: Self-pay | Admitting: Family Medicine

## 2021-08-31 DIAGNOSIS — Z1231 Encounter for screening mammogram for malignant neoplasm of breast: Secondary | ICD-10-CM

## 2021-10-10 ENCOUNTER — Ambulatory Visit
Admission: RE | Admit: 2021-10-10 | Discharge: 2021-10-10 | Disposition: A | Discharge status: Home / Self Care | Payer: 59 | Source: Ambulatory Visit | Attending: Family Medicine | Admitting: Family Medicine

## 2021-10-10 DIAGNOSIS — Z1231 Encounter for screening mammogram for malignant neoplasm of breast: Secondary | ICD-10-CM | POA: Diagnosis present

## 2021-12-25 ENCOUNTER — Ambulatory Visit (INDEPENDENT_AMBULATORY_CARE_PROVIDER_SITE_OTHER): Payer: 59 | Admitting: Obstetrics

## 2021-12-25 VITALS — BP 155/102 | HR 88 | Ht 62.0 in | Wt 162.0 lb

## 2021-12-25 DIAGNOSIS — N951 Menopausal and female climacteric states: Secondary | ICD-10-CM | POA: Insufficient documentation

## 2021-12-25 DIAGNOSIS — N809 Endometriosis, unspecified: Secondary | ICD-10-CM | POA: Diagnosis not present

## 2021-12-25 DIAGNOSIS — Z3480 Encounter for supervision of other normal pregnancy, unspecified trimester: Secondary | ICD-10-CM

## 2021-12-25 MED ORDER — ESTRADIOL 1 MG PO TABS: 1.5000 mg | ORAL_TABLET | Freq: Every day | ORAL | 3 refills | Status: DC

## 2022-01-17 ENCOUNTER — Ambulatory Visit: Payer: 59 | Admitting: Obstetrics and Gynecology

## 2022-01-17 DIAGNOSIS — N951 Menopausal and female climacteric states: Secondary | ICD-10-CM

## 2022-01-31 ENCOUNTER — Ambulatory Visit (INDEPENDENT_AMBULATORY_CARE_PROVIDER_SITE_OTHER): Payer: 59 | Admitting: Obstetrics and Gynecology

## 2022-01-31 ENCOUNTER — Encounter: Payer: Self-pay | Admitting: Obstetrics and Gynecology

## 2022-01-31 VITALS — BP 148/98 | HR 66 | Ht 62.0 in | Wt 163.4 lb

## 2022-01-31 DIAGNOSIS — N951 Menopausal and female climacteric states: Secondary | ICD-10-CM | POA: Diagnosis not present

## 2022-01-31 DIAGNOSIS — N809 Endometriosis, unspecified: Secondary | ICD-10-CM | POA: Diagnosis not present

## 2022-01-31 NOTE — Progress Notes (Signed)
HPI:      [name] is a 46 y.o. [phone] who LMP was Patient's last menstrual period was 03/04/2017 (exact date).  Subjective:   She presents today stating that her hot flashes have completely resolved with 1.5 mg of Estrace as previously prescribed.  She is very happy on the medicine and plans to continue.  She has no other questions or concerns at this time    Hx: The following portions of the patient's history were reviewed and updated as appropriate:             She  has a past medical history of Anxiety, Anxiety (05/25/2020), Endometriosis, and Hernia, abdominal. She does not have any pertinent problems on file. She  has a past surgical history that includes Bunionectomy (Bilateral, 2002 (left), 2009 (right)); Colonoscopy with propofol (N/A, 11/02/2016); Laparoscopic vaginal hysterectomy with salpingo oophorectomy (Bilateral, 03/11/2017); Oophorectomy; laparoscopy (N/A, 02/07/2018); and Abdominal hysterectomy. Her family history includes Breast cancer in her maternal aunt; Breast cancer (age of onset: 40) in her maternal grandmother; CAD in her father; Endometriosis in her mother; Hypertension in her father and mother. She  reports that she has never smoked. She has never used smokeless tobacco. She reports current alcohol use. She reports that she does not use drugs. She has a current medication list which includes the following prescription(s): escitalopram and estradiol. She is allergic to tape.       Review of Systems:  Review of Systems  Constitutional: Denied constitutional symptoms, night sweats, recent illness, fatigue, fever, insomnia and weight loss.  Eyes: Denied eye symptoms, eye pain, photophobia, vision change and visual disturbance.  Ears/Nose/Throat/Neck: Denied ear, nose, throat or neck symptoms, hearing loss, nasal discharge, sinus congestion and sore throat.  Cardiovascular: Denied cardiovascular symptoms, arrhythmia, chest pain/pressure, edema, exercise  intolerance, orthopnea and palpitations.  Respiratory: Denied pulmonary symptoms, asthma, pleuritic pain, productive sputum, cough, dyspnea and wheezing.  Gastrointestinal: Denied, gastro-esophageal reflux, melena, nausea and vomiting.  Genitourinary: Denied genitourinary symptoms including symptomatic vaginal discharge, pelvic relaxation issues, and urinary complaints.  Musculoskeletal: Denied musculoskeletal symptoms, stiffness, swelling, muscle weakness and myalgia.  Dermatologic: Denied dermatology symptoms, rash and scar.  Neurologic: Denied neurology symptoms, dizziness, headache, neck pain and syncope.  Psychiatric: Denied psychiatric symptoms, anxiety and depression.  Endocrine: See HPI for additional information.   Meds:   Current Outpatient Medications on File Prior to Visit  Medication Sig Dispense Refill   escitalopram (LEXAPRO) 5 MG tablet Take 5 mg by mouth daily.     estradiol (ESTRACE) 1 MG tablet Take 1.5 tablets (1.5 mg total) by mouth daily. 135 tablet 3   No current facility-administered medications on file prior to visit.      Objective:     Vitals:   01/31/22 1542  BP: (!) 148/98  Pulse: 66   Filed Weights   01/31/22 1542  Weight: 163 lb 6.4 oz (74.1 kg)                        Assessment:    [id number] Patient Active Problem List   Diagnosis Date Noted   Menopausal syndrome (hot flashes) 12/25/2021   Right groin pain 01/30/2018   Post-operative state 03/11/2017   Abnormal CT scan, colon    Abnormal abdominal CT scan    Abdominal pain 10/31/2016   Abdominal pain, RLQ      1. Menopausal symptom   2. Endometriosis determined by laparoscopy     Patient doing well on  ERT   Plan:            1.  Continue ERT  Questions answered Orders No orders of the defined types were placed in this encounter.   No orders of the defined types were placed in this encounter.     F/U  Return for Annual Physical. I spent 14 minutes involved in the care  of this patient preparing to see the patient by obtaining and reviewing her medical history (including labs, imaging tests and prior procedures), documenting clinical information in the electronic health record (EHR), counseling and coordinating care plans, writing and sending prescriptions, ordering tests or procedures and in direct communicating with the patient and medical staff discussing pertinent items from her history and physical exam.  Finis Bud, M.D. 01/31/2022 4:15 PM

## 2022-01-31 NOTE — Progress Notes (Signed)
Patient presents today to follow-up on HRT. She states since taking 1.5 mg estradiol she is no longer symptomatic. No additional concerns at this time.

## 2022-04-26 ENCOUNTER — Encounter: Payer: Self-pay | Admitting: Obstetrics

## 2022-04-26 NOTE — Progress Notes (Signed)
Obstetrics & Gynecology Office Visit   Chief Complaint:  Chief Complaint  Patient presents with   Follow-up    History of Present Illness: Shelly Middleton presents to talk about hot flashes and to inquire about treatment.she is a 47 year old , with a hx of previous hysterectomy. She has been having regular hot flashes that are bothersome. Shelly Middleton works for the [employer]. She has never taken HRT. Has not used Estrogen cream either. Her last period was in 2019. She is married ,a dsexually active. She has noticed someincreased irritation with IC since menopause.   Review of Systems:  Review of Systems  Constitutional: Negative.   HENT: Negative.    Eyes: Negative.   Respiratory: Negative.    Cardiovascular: Negative.   Gastrointestinal: Negative.   Genitourinary: Negative.   Musculoskeletal: Negative.   Skin: Negative.   Neurological: Negative.   Psychiatric/Behavioral: Negative.       Past Medical History:  Past Medical History:  Diagnosis Date   Anxiety    Anxiety 05/25/2020   Endometriosis    Hernia, abdominal     Past Surgical History:  Past Surgical History:  Procedure Laterality Date   ABDOMINAL HYSTERECTOMY     BUNIONECTOMY Bilateral 2002 (left), 2009 (right)   COLONOSCOPY WITH PROPOFOL N/A 11/02/2016   Procedure: COLONOSCOPY WITH PROPOFOL;  Surgeon: Lucilla Lame, MD;  Location: [facility];  Service: Endoscopy;  Laterality: N/A;   LAPAROSCOPIC VAGINAL HYSTERECTOMY WITH SALPINGO OOPHORECTOMY Bilateral 03/11/2017   Procedure: LAPAROSCOPIC ASSISTED VAGINAL HYSTERECTOMY WITH BILATERAL OOPHERECTOMY;  Surgeon: Harlin Heys, MD;  Location: [facility];  Service: Gynecology;  Laterality: Bilateral;   LAPAROSCOPY N/A 02/07/2018   Procedure: LAPAROSCOPY DIAGNOSTIC;  Surgeon: Robert Bellow, MD;  Location: [facility];  Service: General;  Laterality: N/A;   OOPHORECTOMY      Gynecologic History: Patient's last menstrual period was 03/04/2017 (exact  date).  Obstetric History: [id number]  Family History:  Family History  Problem Relation Age of Onset   Hypertension Mother    Endometriosis Mother    Hypertension Father    CAD Father    Breast cancer Maternal Grandmother 72       and possibly 1 or 2 of grandmother's sisters   Breast cancer Maternal Aunt     Social History:  Social History   Socioeconomic History   Marital status: Married    Spouse name: Not on file   Number of children: Not on file   Years of education: Not on file   Highest education level: Not on file  Occupational History   Not on file  Tobacco Use   Smoking status: Never   Smokeless tobacco: Never  Vaping Use   Vaping Use: Never used  Substance and Sexual Activity   Alcohol use: Yes    Comment: occ   Drug use: No   Sexual activity: Yes    Birth control/protection: Surgical    Comment: hysterectomy  Other Topics Concern   Not on file  Social History Narrative   Not on file   Social Determinants of Health   Financial Resource Strain: Not on file  Food Insecurity: Not on file  Transportation Needs: Not on file  Physical Activity: Not on file  Stress: Not on file  Social Connections: Not on file  Intimate Partner Violence: Not on file    Allergies:  Allergies  Allergen Reactions   Tape Rash    Reaction only happens on feet, the rest of the body is ok  Medications: Prior to Admission medications   Medication Sig Start Date End Date Taking? Authorizing Provider  escitalopram (LEXAPRO) 5 MG tablet Take 5 mg by mouth daily.    [provider]  estradiol (ESTRACE) 1 MG tablet Take 1.5 tablets (1.5 mg total) by mouth daily. 12/25/21 12/25/22  Imagene Riches, CNM    Physical Exam Vitals:  Vitals:   12/25/21 1516  BP: (!) 155/102  Pulse: 88   Patient's last menstrual period was 03/04/2017 (exact date).  Physical Exam Constitutional:      Appearance: Normal appearance.  Cardiovascular:     Rate and Rhythm: Normal  rate and regular rhythm.     Pulses: Normal pulses.     Heart sounds: Normal heart sounds.  Pulmonary:     Effort: Pulmonary effort is normal.     Breath sounds: Normal breath sounds.  Genitourinary:    General: Normal vulva.     Rectum: Normal.     Comments: No external lesions or areas of irritation. No obvious vaginal discharge. Uterus is absent . No adnexal tenderness. Some decreased vaginal tone noted. Musculoskeletal:     Cervical back: Normal range of motion and neck supple.  Skin:    General: Skin is warm and dry.  Neurological:     Mental Status: She is alert.  Psychiatric:        Mood and Affect: Mood normal.        Behavior: Behavior normal.      Assessment: 47 y.o. [id number] with menopausal hot flashes  Plan: Problem List Items Addressed This Visit       Cardiovascular and Mediastinum   Menopausal syndrome (hot flashes)   Other Visit Diagnoses     Supervision of other normal pregnancy, antepartum    -  Primary   Endometriosis determined by laparoscopy       Relevant Medications   estradiol (ESTRACE) 1 MG tablet     We discussed the use of HRT, which in her case can be addressed with oral Estrogen or in patch form. She desires a trial fo the oral medication. I have prescribed her Estrace tablets, and she plans to follow up in about two months. The slightly increased risks of longterm use of HRT are discussed with her.  Imagene Riches, CNM  04/26/2022 12:26 PM

## 2022-08-27 ENCOUNTER — Other Ambulatory Visit: Payer: Self-pay | Admitting: Family Medicine

## 2022-08-27 DIAGNOSIS — Z1231 Encounter for screening mammogram for malignant neoplasm of breast: Secondary | ICD-10-CM

## 2022-09-18 IMAGING — MG MM DIGITAL DIAGNOSTIC UNILAT*R* W/ TOMO W/ CAD
4 series · 4 of 12 positions shown · non-contrast
Comparison: Previous exam(s).

CLINICAL DATA: Recall from screening mammogram for possible
asymmetry in the right breast.

EXAM:
DIGITAL DIAGNOSTIC UNILATERAL RIGHT MAMMOGRAM WITH TOMOSYNTHESIS AND
CAD; ULTRASOUND RIGHT BREAST LIMITED
TECHNIQUE: Right digital diagnostic mammography and breast tomosynthesis was
performed. The images were evaluated with computer-aided detection.;
Targeted ultrasound examination of the right breast was performed

[R ML synth-2D]
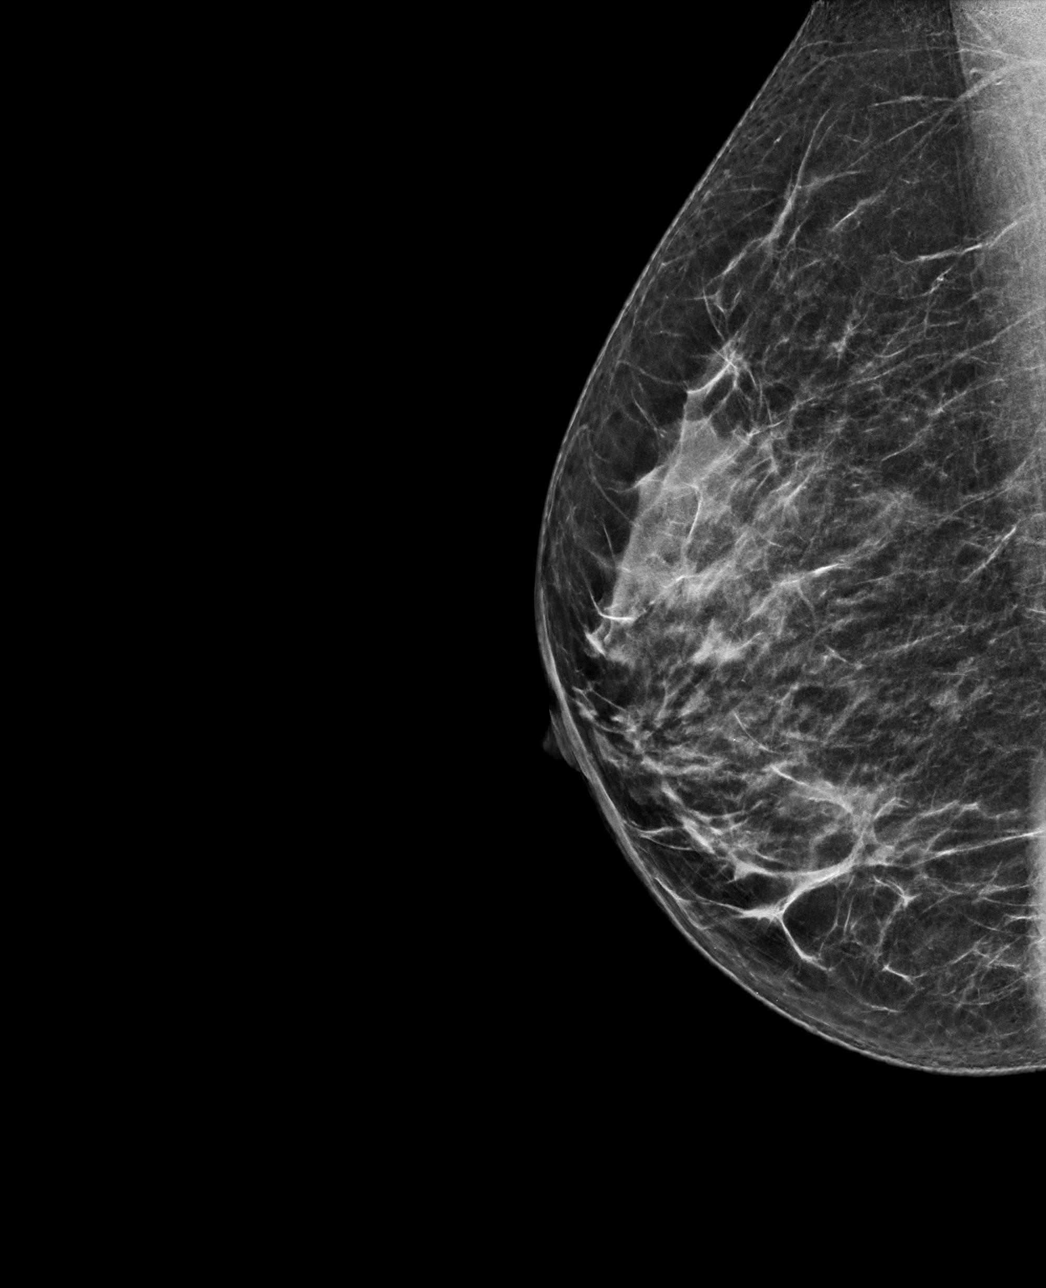

[R MLO synth-2D]
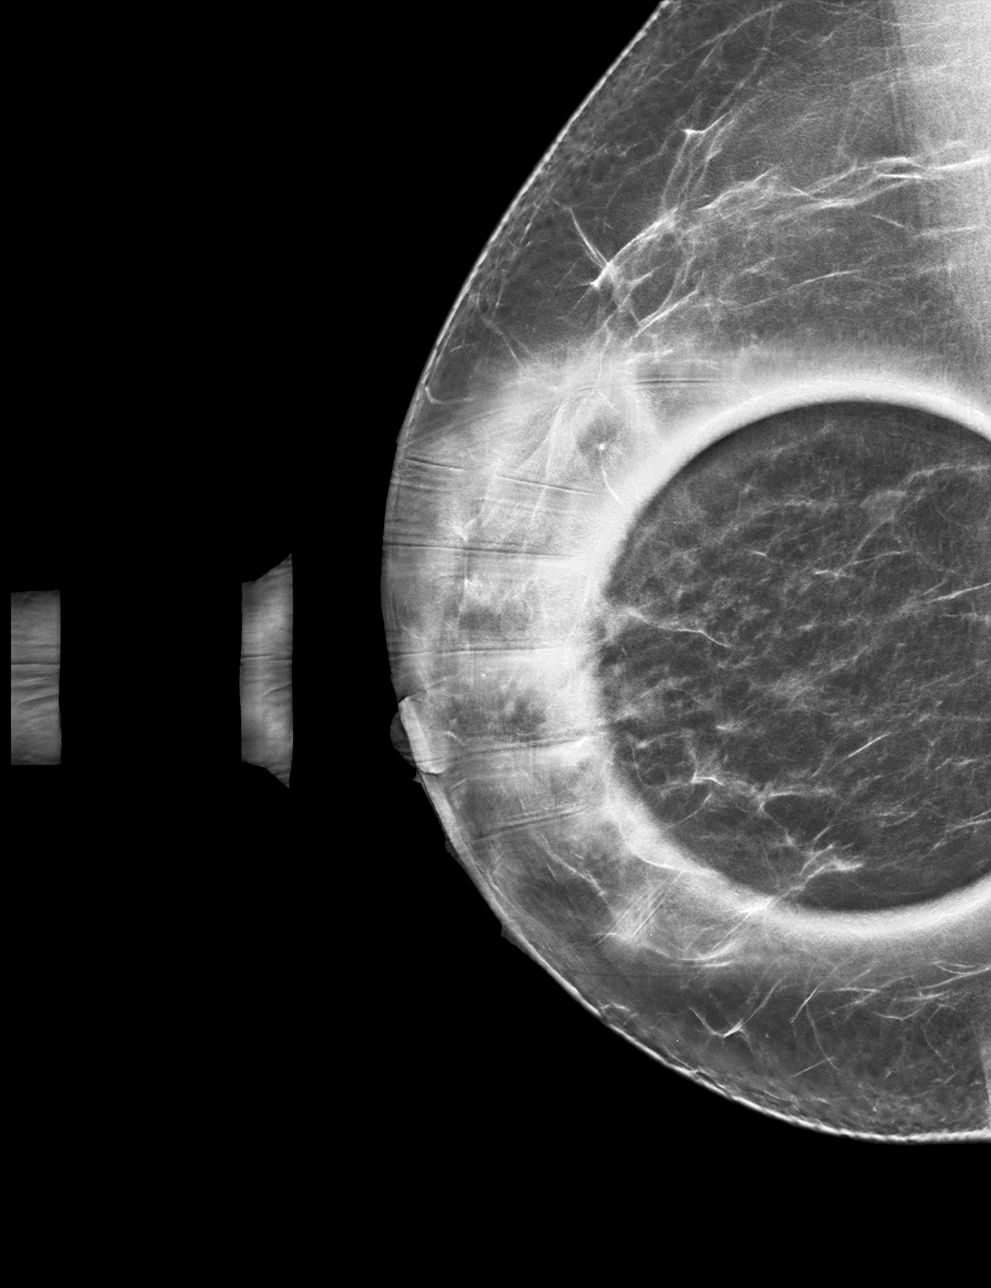

[R MLO tomo · tomo slice 33/64.0]
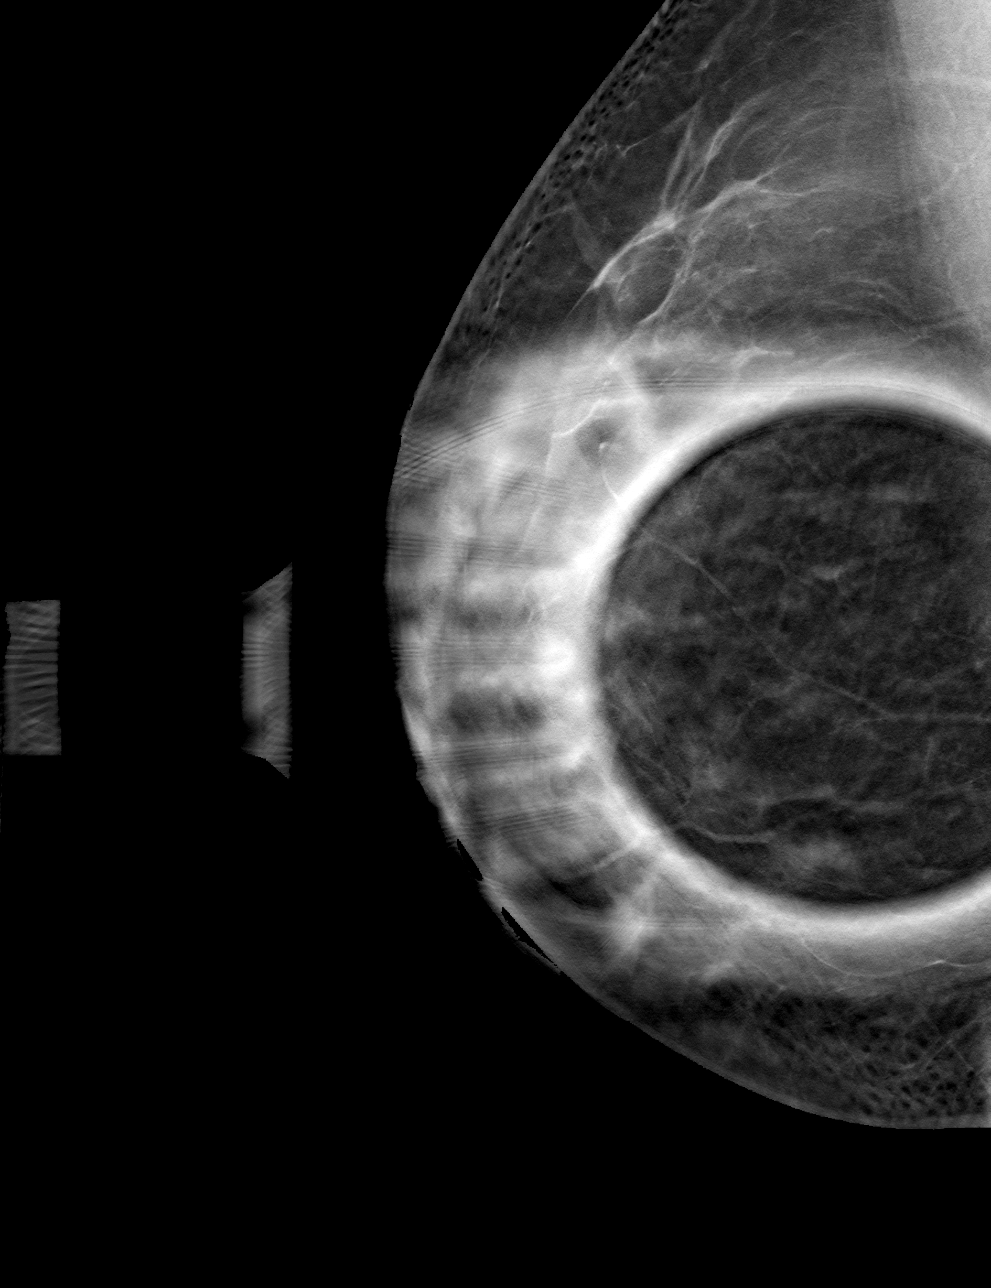

[R ML tomo · tomo slice 28/55.0]
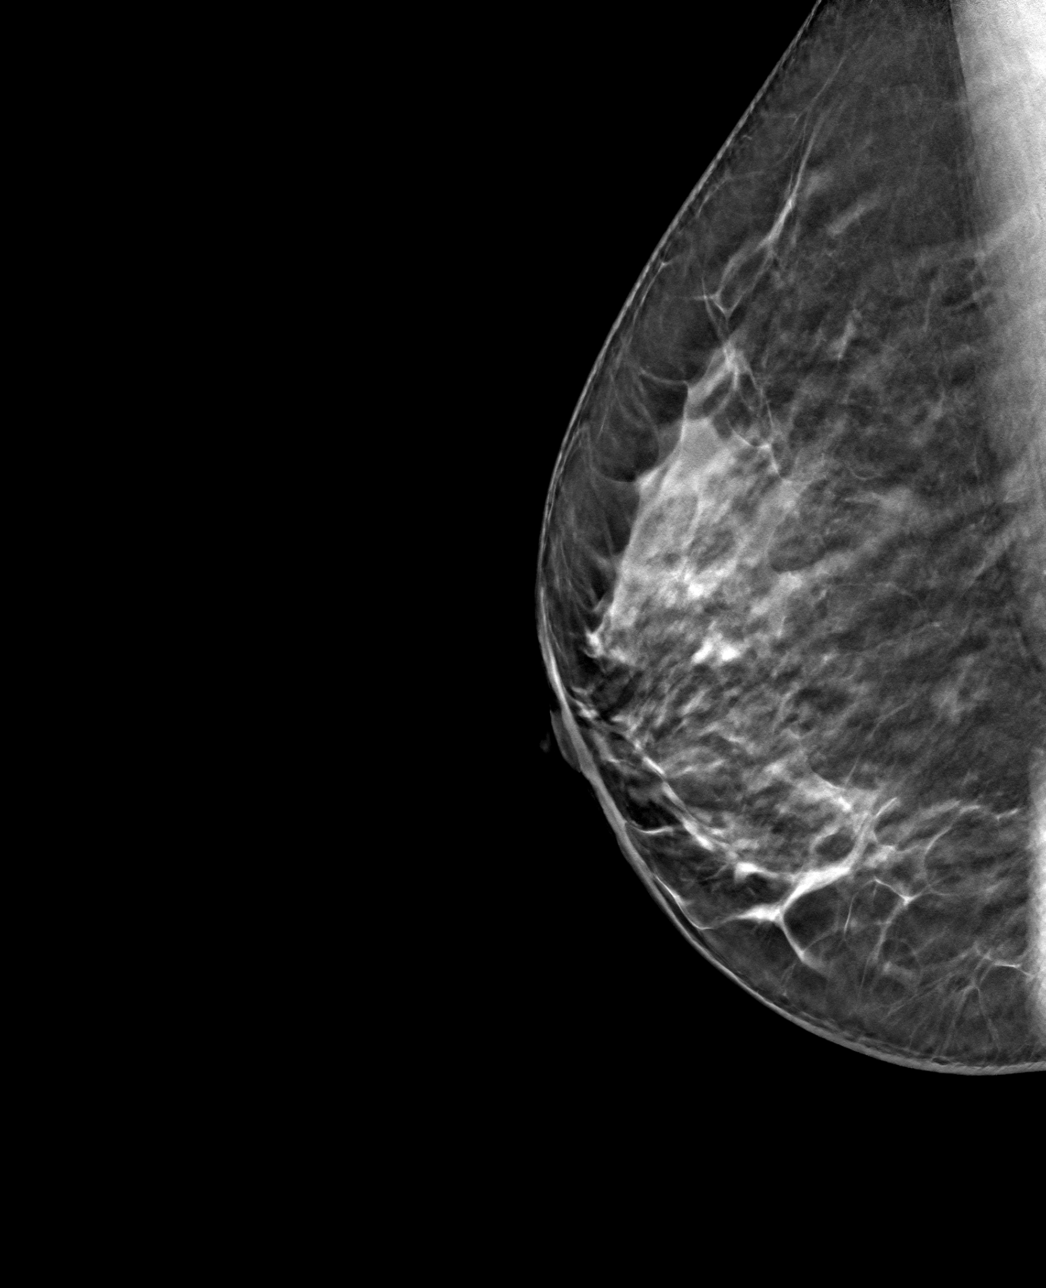

[4 of 12 positions shown; findings below may reference images not displayed]

ACR Breast Density Category c: The breast tissue is heterogeneously
dense, which may obscure small masses.
FINDINGS: Additional views including spot compression demonstrate an oval
circumscribed 7 mm mass in the outer right breast.

Targeted right breast ultrasound was performed.

At 8:30 o'clock 7 cm from the nipple a benign cyst measures 7 x 3 x
7 mm. This corresponds to the mass seen on the mammogram. No
suspicious solid mass is identified.
IMPRESSION: Benign cyst in the right breast.  No evidence of malignancy.

RECOMMENDATION:
Recommend routine annual screening mammogram in 1 year.

I have discussed the findings and recommendations with the patient.
If applicable, a reminder letter will be sent to the patient
regarding the next appointment.

BI-RADS CATEGORY  2: Benign.

## 2022-10-31 ENCOUNTER — Ambulatory Visit
Admission: RE | Admit: 2022-10-31 | Discharge: 2022-10-31 | Disposition: A | Discharge status: Home / Self Care | Payer: 59 | Source: Ambulatory Visit | Attending: Family Medicine | Admitting: Family Medicine

## 2022-10-31 DIAGNOSIS — Z1231 Encounter for screening mammogram for malignant neoplasm of breast: Secondary | ICD-10-CM | POA: Diagnosis present

## 2023-03-15 ENCOUNTER — Telehealth: Payer: Self-pay

## 2023-03-15 DIAGNOSIS — N809 Endometriosis, unspecified: Secondary | ICD-10-CM

## 2023-03-15 MED ORDER — ESTRADIOL 1 MG PO TABS: 1.5000 mg | ORAL_TABLET | Freq: Every day | ORAL | 0 refills | Status: DC

## 2023-03-15 NOTE — Telephone Encounter (Signed)
Chart reviewed. Patient has appointment with Helmut Muster Copland 04/08/23. QTY #30 sent.

## 2023-04-08 ENCOUNTER — Ambulatory Visit (INDEPENDENT_AMBULATORY_CARE_PROVIDER_SITE_OTHER): Payer: 59 | Admitting: Obstetrics and Gynecology

## 2023-04-08 ENCOUNTER — Encounter: Payer: Self-pay | Admitting: Obstetrics and Gynecology

## 2023-04-08 VITALS — BP 143/85 | HR 67 | Ht 62.0 in | Wt 143.0 lb

## 2023-04-08 DIAGNOSIS — N951 Menopausal and female climacteric states: Secondary | ICD-10-CM

## 2023-04-08 DIAGNOSIS — Z79890 Hormone replacement therapy (postmenopausal): Secondary | ICD-10-CM | POA: Diagnosis not present

## 2023-04-08 DIAGNOSIS — R37 Sexual dysfunction, unspecified: Secondary | ICD-10-CM

## 2023-04-08 MED ORDER — PROGESTERONE MICRONIZED 100 MG PO CAPS: ORAL_CAPSULE | ORAL | 0 refills | Status: DC

## 2023-04-08 NOTE — Addendum Note (Signed)
 Addended by: Alyn Judge B on: 04/08/2023 04:06 PM   Modules accepted: Level of Service

## 2023-04-08 NOTE — Patient Instructions (Signed)
 I value your feedback and you entrusting [place] with your care. If you get a [facility] patient survey, I would appreciate you taking the time to let us know about your experience today. Thank you! ? ? ?

## 2023-04-08 NOTE — Progress Notes (Addendum)
 Middleton, Shelly Cue, PA-C   Chief Complaint  Patient presents with   Gynecologic Exam    Decreased libido. Discuss adjusting estradiol . Hot flashes are increasing slowly    HPI:      [name] is a 48 y.o. [id number] whose LMP was Patient's last menstrual period was 03/04/2017 (exact date)., presents today for increasing hot flashes, cold intolerance, mood changes, trouble sleeping. Also under significant new work stress recently. Sleeping 7-8 hours nightly but always feel tired. Minimal exercise. Normal labs with PCP 7/24. Long hx of sexual dysfunction--has desire and want but just doesn't go through with it. No pain/bleeding/dryness with sex.  S/p Laparoscopic vaginal hysterectomy with salpingo oophorectomy (Bilateral, 03/11/2017) 2019 for endometriosis. Did OCPs for VS sx after hyst, then changed to estrace  1.5 mg for the past year and was doing well, till more recently. No side effects.  Neg mammo 9/24  Patient Active Problem List   Diagnosis Date Noted   Menopausal syndrome (hot flashes) 12/25/2021   Right groin pain 01/30/2018   Post-operative state 03/11/2017   Abnormal CT scan, colon    Abnormal abdominal CT scan    Abdominal pain 10/31/2016   Abdominal pain, RLQ     Past Surgical History:  Procedure Laterality Date   ABDOMINAL HYSTERECTOMY     BUNIONECTOMY Bilateral 2002 (left), 2009 (right)   COLONOSCOPY WITH PROPOFOL  N/A 11/02/2016   Procedure: COLONOSCOPY WITH PROPOFOL ;  Surgeon: Marnee Sink, MD;  Location: [facility];  Service: Endoscopy;  Laterality: N/A;   LAPAROSCOPIC VAGINAL HYSTERECTOMY WITH SALPINGO OOPHORECTOMY Bilateral 03/11/2017   Procedure: LAPAROSCOPIC ASSISTED VAGINAL HYSTERECTOMY WITH BILATERAL OOPHERECTOMY;  Surgeon: Zenobia Hila, MD;  Location: [facility];  Service: Gynecology;  Laterality: Bilateral;   LAPAROSCOPY N/A 02/07/2018   Procedure: LAPAROSCOPY DIAGNOSTIC;  Surgeon: Marshall Skeeter, MD;  Location: [facility];  Service:  General;  Laterality: N/A;   OOPHORECTOMY      Family History  Problem Relation Age of Onset   Hypertension Mother    Endometriosis Mother    Hypertension Father    CAD Father    Breast cancer Maternal Grandmother 6       and possibly 1 or 2 of grandmother's sisters   Breast cancer Maternal Aunt     Social History   Socioeconomic History   Marital status: Married    Spouse name: Not on file   Number of children: Not on file   Years of education: Not on file   Highest education level: Not on file  Occupational History   Not on file  Tobacco Use   Smoking status: Never   Smokeless tobacco: Never  Vaping Use   Vaping status: Never Used  Substance and Sexual Activity   Alcohol use: Yes    Comment: occ   Drug use: No   Sexual activity: Yes    Birth control/protection: Surgical    Comment: hysterectomy  Other Topics Concern   Not on file  Social History Narrative   Not on file   Social Drivers of Health   Financial Resource Strain: Low Risk  (09/10/2022)   Received from [facility]   Overall Financial Resource Strain (CARDIA)    Difficulty of Paying Living Expenses: Not hard at all  Food Insecurity: No Food Insecurity (09/10/2022)   Received from [facility]   Hunger Vital Sign    Worried About Running Out of Food in the Last Year: Never true    Ran  Out of Food in the Last Year: Never true  Transportation Needs: No Transportation Needs (09/10/2022)   Received [facility]    In the past 12 months, has lack of transportation kept you from medical appointments or from getting medications?: No    Lack of Transportation (Non-Medical): No  Physical Activity: Not on file  Stress: Not on file  Social Connections: Not on file  Intimate Partner Violence: Not on file    Outpatient Medications Prior to Visit  Medication Sig Dispense Refill   estradiol  (ESTRACE ) 1 MG tablet Take 1.5  tablets (1.5 mg total) by mouth daily. 30 tablet 0   sertraline (ZOLOFT) 50 MG tablet Take 1 tablet by mouth daily.     escitalopram (LEXAPRO) 5 MG tablet Take 5 mg by mouth daily. (Patient not taking: Reported on 04/08/2023)     No facility-administered medications prior to visit.      ROS:  Review of Systems  Constitutional:  Negative for fever.  Gastrointestinal:  Negative for blood in stool, constipation, diarrhea, nausea and vomiting.  Endocrine: Positive for cold intolerance and heat intolerance.  Genitourinary:  Negative for dyspareunia, dysuria, flank pain, frequency, hematuria, urgency, vaginal bleeding, vaginal discharge and vaginal pain.  Musculoskeletal:  Negative for back pain.  Skin:  Negative for rash.   BREAST: No symptoms   OBJECTIVE:   Vitals:  BP (!) 162/88   Pulse 69   Ht 5\' 2"  (1.575 m)   Wt 143 lb (64.9 kg)   LMP 03/04/2017 (Exact Date)   BMI 26.16 kg/m   Repeat BP 143/85 Was normal 2/25 at [facility]  Physical Exam Vitals reviewed.  Constitutional:      Appearance: She is well-developed.  Pulmonary:     Effort: Pulmonary effort is normal.  Musculoskeletal:        General: Normal range of motion.     Cervical back: Normal range of motion.  Skin:    General: Skin is warm and dry.  Neurological:     General: No focal deficit present.     Mental Status: She is alert and oriented to person, place, and time.     Cranial Nerves: No cranial nerve deficit.  Psychiatric:        Mood and Affect: Mood normal.        Behavior: Behavior normal.        Thought Content: Thought content normal.        Judgment: Judgment normal.    Assessment/Plan: Vasomotor symptoms due to menopause - Plan: progesterone  (PROMETRIUM ) 100 MG capsule; pt on good dose of ERT. Discussed adding prog to decrease E/P ratio and improve sx. Also discussed chronic stress as cause of increased sx. Rx prog, pt to f/u via [employer] msg in 3 months/sooner prn. If needed, can increase prog dose  at that time.   Hormone replacement therapy (HRT)  Sexual dysfunction--discussed having desire is not so much decreased libido but to try to determine reasons why encounter not happening. Adding prog to see if that helps. Can try testosterone prn but may not be helpful since pt has long hx.   Elevated blood pressure--normal reading 2/25 at [facility]  Meds ordered this encounter  Medications   progesterone  (PROMETRIUM ) 100 MG capsule    Sig: Take 1 cap nightly, 6 nights on, 1 night off    Dispense:  90 capsule    Refill:  0    Supervising Provider:   CHERRY, ANIKA [[id number]]  Return if symptoms worsen or fail to improve.  Levaughn Puccinelli B. Cathy Ropp, PA-C 04/08/2023 4:02 PM

## 2023-04-16 ENCOUNTER — Telehealth: Payer: Self-pay

## 2023-04-16 ENCOUNTER — Encounter: Payer: Self-pay | Admitting: Obstetrics and Gynecology

## 2023-04-16 DIAGNOSIS — N809 Endometriosis, unspecified: Secondary | ICD-10-CM

## 2023-04-16 MED ORDER — ESTRADIOL 1 MG PO TABS: 1.5000 mg | ORAL_TABLET | Freq: Every day | ORAL | 0 refills | Status: DC

## 2023-04-16 NOTE — Telephone Encounter (Signed)
Pt calling triage asking if Rx RF estradiol (ESTRACE) 1 MG tablet  can be sent. RF sent, pt aware.

## 2023-04-30 ENCOUNTER — Encounter: Payer: Self-pay | Admitting: Obstetrics and Gynecology

## 2023-05-08 NOTE — Progress Notes (Unsigned)
 Whitaker, Jason Hestle, PA-C   No chief complaint on file.   HPI:      Shelly Middleton is a 48 y.o. [phone] whose LMP was Patient's last menstrual period was 03/04/2017 (exact date)., presents today for ***  RT breast tenderness for over a weeek, no caffeine use; started prog 1 mo ago, on same ERT dose Neg mammo 9/24  Patient Active Problem List   Diagnosis Date Noted   Menopausal syndrome (hot flashes) 12/25/2021   Right groin pain 01/30/2018   Post-operative state 03/11/2017   Abnormal CT scan, colon    Abnormal abdominal CT scan    Abdominal pain 10/31/2016   Abdominal pain, RLQ     Past Surgical History:  Procedure Laterality Date   ABDOMINAL HYSTERECTOMY     BUNIONECTOMY Bilateral 2002 (left), 2009 (right)   COLONOSCOPY WITH PROPOFOL N/A 11/02/2016   Procedure: COLONOSCOPY WITH PROPOFOL;  Surgeon: Midge Minium, MD;  Location: [facility];  Service: Endoscopy;  Laterality: N/A;   LAPAROSCOPIC VAGINAL HYSTERECTOMY WITH SALPINGO OOPHORECTOMY Bilateral 03/11/2017   Procedure: LAPAROSCOPIC ASSISTED VAGINAL HYSTERECTOMY WITH BILATERAL OOPHERECTOMY;  Surgeon: Linzie Collin, MD;  Location: [facility];  Service: Gynecology;  Laterality: Bilateral;   LAPAROSCOPY N/A 02/07/2018   Procedure: LAPAROSCOPY DIAGNOSTIC;  Surgeon: Earline Mayotte, MD;  Location: [facility];  Service: General;  Laterality: N/A;   OOPHORECTOMY      Family History  Problem Relation Age of Onset   Hypertension Mother    Endometriosis Mother    Hypertension Father    CAD Father    Breast cancer Maternal Grandmother 31       and possibly 1 or 2 of grandmother's sisters   Breast cancer Maternal Aunt     Social History   Socioeconomic History   Marital status: Married    Spouse name: Not on file   Number of children: Not on file   Years of education: Not on file   Highest education level: Not on file  Occupational History   Not on file  Tobacco Use   Smoking status: Never   Smokeless  tobacco: Never  Vaping Use   Vaping status: Never Used  Substance and Sexual Activity   Alcohol use: Yes    Comment: occ   Drug use: No   Sexual activity: Yes    Birth control/protection: Surgical    Comment: hysterectomy  Other Topics Concern   Not on file  Social History Narrative   Not on file   Social Drivers of Health   Financial Resource Strain: Low Risk  (09/10/2022)   Received from [facility]   Overall Financial Resource Strain (CARDIA)    Difficulty of Paying Living Expenses: Not hard at all  Food Insecurity: No Food Insecurity (09/10/2022)   Received from [facility]   Hunger Vital Sign    Worried About Running Out of Food in the Last Year: Never true    Ran Out of Food in the Last Year: Never true  Transportation Needs: No Transportation Needs (09/10/2022)   Received from [facility] - Transportation    In the past 12 months, has lack of transportation kept you from medical appointments or from getting medications?: No    Lack of Transportation (Non-Medical): No  Physical Activity: Not on file  Stress: Not on file  Social Connections: Not on file  Intimate Partner Violence: Not on file    Outpatient Medications Prior  to Visit  Medication Sig Dispense Refill   escitalopram (LEXAPRO) 5 MG tablet Take 5 mg by mouth daily. (Patient not taking: Reported on 04/08/2023)     estradiol (ESTRACE) 1 MG tablet Take 1.5 tablets (1.5 mg total) by mouth daily. 135 tablet 0   progesterone (PROMETRIUM) 100 MG capsule Take 1 cap nightly, 6 nights on, 1 night off 90 capsule 0   sertraline (ZOLOFT) 50 MG tablet Take 1 tablet by mouth daily.     No facility-administered medications prior to visit.      ROS:  Review of Systems BREAST: No symptoms   OBJECTIVE:   Vitals:  LMP 03/04/2017 (Exact Date)   Physical Exam  Results: No results found for this or any previous visit (from the past 24  hours).   Assessment/Plan: No diagnosis found.    No orders of the defined types were placed in this encounter.     No follow-ups on file.  Deysy Schabel B. Khalik Pewitt, PA-C 05/08/2023 3:42 PM

## 2023-05-09 ENCOUNTER — Encounter: Payer: Self-pay | Admitting: Obstetrics and Gynecology

## 2023-05-09 ENCOUNTER — Ambulatory Visit (INDEPENDENT_AMBULATORY_CARE_PROVIDER_SITE_OTHER): Admitting: Obstetrics and Gynecology

## 2023-05-09 VITALS — BP 136/81 | HR 192 | Ht 63.0 in | Wt 145.0 lb

## 2023-05-09 DIAGNOSIS — N644 Mastodynia: Secondary | ICD-10-CM

## 2023-05-09 NOTE — Patient Instructions (Addendum)
 I value your feedback and you entrusting [place] with your care. If you get a [facility] patient survey, I would appreciate you taking the time to let us know about your experience today. Thank you!  [facility] ([place])--[phone]

## 2023-05-13 ENCOUNTER — Encounter: Payer: Self-pay | Admitting: Obstetrics and Gynecology

## 2023-05-13 ENCOUNTER — Ambulatory Visit
Admission: RE | Admit: 2023-05-13 | Discharge: 2023-05-13 | Disposition: A | Discharge status: Home / Self Care | Source: Ambulatory Visit | Attending: Obstetrics and Gynecology | Admitting: Obstetrics and Gynecology

## 2023-05-13 ENCOUNTER — Ambulatory Visit
Admission: RE | Admit: 2023-05-13 | Discharge: 2023-05-13 | Disposition: A | Discharge status: Home / Self Care | Source: Ambulatory Visit | Attending: Obstetrics and Gynecology

## 2023-05-13 DIAGNOSIS — N644 Mastodynia: Secondary | ICD-10-CM | POA: Diagnosis present

## 2023-05-30 ENCOUNTER — Encounter: Payer: Self-pay | Admitting: Obstetrics and Gynecology

## 2023-05-30 DIAGNOSIS — R37 Sexual dysfunction, unspecified: Secondary | ICD-10-CM

## 2023-05-30 DIAGNOSIS — Z79890 Hormone replacement therapy (postmenopausal): Secondary | ICD-10-CM

## 2023-05-30 DIAGNOSIS — N6452 Nipple discharge: Secondary | ICD-10-CM

## 2023-05-30 DIAGNOSIS — N644 Mastodynia: Secondary | ICD-10-CM

## 2023-06-04 ENCOUNTER — Ambulatory Visit
Admission: RE | Admit: 2023-06-04 | Discharge: 2023-06-04 | Disposition: A | Discharge status: Home / Self Care | Source: Ambulatory Visit | Attending: Obstetrics and Gynecology | Admitting: Obstetrics and Gynecology

## 2023-06-04 DIAGNOSIS — N644 Mastodynia: Secondary | ICD-10-CM

## 2023-06-05 NOTE — Addendum Note (Signed)
 Addended by: Althea Grimmer B on: 06/05/2023 05:22 PM   Modules accepted: Orders

## 2023-06-10 NOTE — Telephone Encounter (Signed)
 Thurs appt

## 2023-06-13 ENCOUNTER — Encounter: Payer: Self-pay | Admitting: General Surgery

## 2023-06-13 ENCOUNTER — Ambulatory Visit (INDEPENDENT_AMBULATORY_CARE_PROVIDER_SITE_OTHER): Payer: Self-pay | Admitting: General Surgery

## 2023-06-13 VITALS — BP 161/96 | HR 73 | Temp 98.2°F | Ht 64.0 in | Wt 143.8 lb

## 2023-06-13 DIAGNOSIS — N6452 Nipple discharge: Secondary | ICD-10-CM

## 2023-06-13 NOTE — Patient Instructions (Addendum)
 Your MRI is scheduled for 06/17/2023 at 4 pm (arrive by 3:30 pm) [facility]    Breast Self-Awareness Breast self-awareness is knowing how your breasts look and feel. You need to: Check your breasts on a regular basis. Tell your doctor about any changes. Become familiar with the look and feel of your breasts. This can help you catch a breast problem while it is still small and can be treated. You should do breast self-exams even if you have breast implants. What you need: A mirror. A well-lit room. A pillow or other soft object. How to do a breast self-exam Follow these steps to do a breast self-exam: Look for changes  Take off all the clothes above your waist. Stand in front of a mirror in a room with good lighting. Put your hands down at your sides. Compare your breasts in the mirror. Look for any difference between them, such as: A difference in shape. A difference in size. Wrinkles, dips, and bumps in one breast and not the other. Look at each breast for changes in the skin, such as: Redness. Scaly areas. Skin that has gotten thicker. Dimpling. Open sores (ulcers). Look for changes in your nipples, such as: Fluid coming out of a nipple. Fluid around a nipple. Bleeding. Dimpling. Redness. A nipple that looks pushed in (retracted), or that has changed position. Feel for changes Lie on your back. Feel each breast. To do this: Pick a breast to feel. Place a pillow under the shoulder closest to that breast. Put the arm closest to that breast behind your head. Feel the nipple area of that breast using the hand of your other arm. Feel the area with the pads of your three middle fingers by making small circles with your fingers. Use light, medium, and firm pressure. Continue the overlapping circles, moving downward over the breast. Keep making circles with your fingers. Stop when you feel your ribs. Start making circles with your fingers again, this time going upward  until you reach your collarbone. Then, make circles outward across your breast and into your armpit area. Squeeze your nipple. Check for discharge and lumps. Repeat these steps to check your other breast. Sit or stand in the tub or shower. With soapy water on your skin, feel each breast the same way you did when you were lying down. Write down what you find Writing down what you find can help you remember what to tell your doctor. Write down: What is normal for each breast. Any changes you find in each breast. These include: The kind of changes you find. A tender or painful breast. Any lump you find. Write down its size and where it is. When you last had your monthly period (menstrual cycle). General tips If you are breastfeeding, the best time to check your breasts is after you feed your baby or after you use a breast pump. If you get monthly bleeding, the best time to check your breasts is 5-7 days after your monthly cycle ends. With time, you will become comfortable with the self-exam. You will also start to know if there are changes in your breasts. Contact a doctor if: You see a change in the shape or size of your breasts or nipples. You see a change in the skin of your breast or nipples, such as red or scaly skin. You have fluid coming from your nipples that is not normal. You find a new lump or thick area. You have breast pain. You have any  concerns about your breast health. Summary Breast self-awareness includes looking for changes in your breasts and feeling for changes within your breasts. You should do breast self-awareness in front of a mirror in a well-lit room. If you get monthly periods (menstrual cycles), the best time to check your breasts is 5-7 days after your period ends. Tell your doctor about any changes you see in your breasts. Changes include changes in size, changes on the skin, painful or tender breasts, or fluid from your nipples that is not normal. This  information is not intended to replace advice given to you by your health care provider. Make sure you discuss any questions you have with your health care provider. Document Revised: 07/20/2021 Document Reviewed: 12/15/2020 Elsevier Patient Education  2024 [employer].

## 2023-06-17 ENCOUNTER — Ambulatory Visit: Admission: RE | Admit: 2023-06-17 | Source: Ambulatory Visit

## 2023-06-18 ENCOUNTER — Ambulatory Visit: Admission: RE | Admit: 2023-06-18 | Source: Ambulatory Visit

## 2023-06-18 NOTE — Progress Notes (Signed)
 Patient ID: Shelly Middleton, female   DOB: 17-Apr-1975, 48 y.o.   MRN: [id number] CC: Left Breast Nipple Discharge History of Present Illness Shelly Middleton is a 48 y.o. female with past medical history as below who presents in consultation for bloody discharge from her left nipple.  The patient reports that this has occurred over the last 3-1/2 weeks.  She describes the discharge as brownish and green.  She says that it is spontaneous in nature.  She also reports that she has some pain in the left breast that she rates as 5 out of 10 and throughout the whole breast.  Of note the patient also reported pain in the right breast with some swelling in the upper outer quadrant in March of this year.  The patient has used ibuprofen  and heating pads for the pain but it has not worked.  She is not taking in any caffeine.  She has a family history of breast cancer and her maternal grandmother.  She underwent a laparoscopic vaginal hysterectomy with BSO in 2019 for endometriosis and then since then has been on estrogen replacement therapy with a addition of progesterone  a month ago.  She denies any previous abnormal mammograms and denies any history of breast biopsy.  Past Medical History Past Medical History:  Diagnosis Date   Anxiety    Anxiety 05/25/2020   Endometriosis    Hernia, abdominal        Past Surgical History:  Procedure Laterality Date   ABDOMINAL HYSTERECTOMY     BUNIONECTOMY Bilateral 2002 (left), 2009 (right)   COLONOSCOPY WITH PROPOFOL  N/A 11/02/2016   Procedure: COLONOSCOPY WITH PROPOFOL ;  Surgeon: Marnee Sink, MD;  Location: [facility];  Service: Endoscopy;  Laterality: N/A;   LAPAROSCOPIC VAGINAL HYSTERECTOMY WITH SALPINGO OOPHORECTOMY Bilateral 03/11/2017   Procedure: LAPAROSCOPIC ASSISTED VAGINAL HYSTERECTOMY WITH BILATERAL OOPHERECTOMY;  Surgeon: Zenobia Hila, MD;  Location: [facility];  Service: Gynecology;  Laterality: Bilateral;   LAPAROSCOPY N/A 02/07/2018   Procedure:  LAPAROSCOPY DIAGNOSTIC;  Surgeon: Marshall Skeeter, MD;  Location: [facility];  Service: General;  Laterality: N/A;   OOPHORECTOMY      Allergies  Allergen Reactions   Tape Rash    Reaction only happens on feet, the rest of the body is ok    Current Outpatient Medications  Medication Sig Dispense Refill   estradiol  (ESTRACE ) 1 MG tablet Take 1.5 tablets (1.5 mg total) by mouth daily. 135 tablet 0   progesterone  (PROMETRIUM ) 100 MG capsule Take 1 cap nightly, 6 nights on, 1 night off 90 capsule 0   sertraline (ZOLOFT) 100 MG tablet Take by mouth.     No current facility-administered medications for this visit.    Family History Family History  Problem Relation Age of Onset   Hypertension Mother    Endometriosis Mother    Hypertension Father    CAD Father    Breast cancer Maternal Grandmother 23       and possibly 1 or 2 of grandmother's sisters   Breast cancer Maternal Aunt        Social History Social History   Tobacco Use   Smoking status: Never   Smokeless tobacco: Never  Vaping Use   Vaping status: Never Used  Substance Use Topics   Alcohol use: Yes    Comment: occ   Drug use: No        ROS Full ROS of systems performed and is otherwise negative there than what is stated in the HPI  Physical Exam Blood pressure (!) 161/96, pulse 73, temperature 98.2 F (36.8 C), temperature source Oral, height 5\' 4"  (1.626 m), weight 143 lb 12.8 oz (65.2 kg), last menstrual period 03/04/2017, SpO2 97%.  Alert and oriented x 3, normal work of breathing on room air, regular rate and rhythm, abdomen is soft, nontender nondistended, breast exam performed in the presence of a chaperone.  Right breast no overlying skin changes or nipple retraction and no dominant masses or axillary lymphadenopathy.  Left breast I was unable to express any discharge from the nipple or any of the ducts.  Some tenderness left lateral nipple areolar complex without any dominant masses, no overlying  skin changes and no left axillary lymphadenopathy  Data Reviewed I reviewed her diagnostic mammogram of her left breast and there is no concerning masses or calcifications  I have personally reviewed the patient's imaging and medical records.    Assessment    Patient with 3 and half week history of left nipple discharge with some pain and swelling of the left breast.  Mammogram without any obvious etiology from this.  Plan    I discussed with patient that while the mammogram shows no etiology she is still having bloody discharge in the differential is broad although top of differential includes intraductal papilloma, response to her new progesterone  therapy, fat necrosis or other malignant lesion.  I discussed with her the options including just observing the output to see if it returns given it seems that the output is slowing down.  I also discussed with her that we could get an MRI to see if there is any lesions not identified on mammogram or we could wait and get an MRI in the short-term follow-up.  Given her history of hormonal replacement therapy she has elected to get MRI now to see if there are any lesions that could be causing this.  I think this is an appropriate choice and we will see her back when the MRI is done  Using GAIL model patient has increased risk of breast cancer at 1.7% risk of 5 year breast cancer risk compatred to 1.2% for average 49 year old woman. She also has 16.9% risk of lifetime breast cancer risk compared with 11.5% for average 49 year old woman.    Barrett Lick

## 2023-06-25 ENCOUNTER — Encounter: Payer: Self-pay | Admitting: *Deleted

## 2023-06-25 ENCOUNTER — Ambulatory Visit: Admitting: General Surgery

## 2023-07-03 ENCOUNTER — Other Ambulatory Visit: Payer: Self-pay | Admitting: Obstetrics and Gynecology

## 2023-07-03 DIAGNOSIS — N951 Menopausal and female climacteric states: Secondary | ICD-10-CM

## 2023-07-03 MED ORDER — PROGESTERONE MICRONIZED 100 MG PO CAPS: ORAL_CAPSULE | ORAL | 2 refills | Status: AC

## 2023-07-03 NOTE — Progress Notes (Signed)
 Rx RF prometrium  for VS sx/doing better

## 2023-07-04 MED ORDER — AMBULATORY NON FORMULARY MEDICATION: 2 refills | Status: AC

## 2023-07-04 NOTE — Addendum Note (Signed)
 Addended by: Alyn Judge B on: 07/04/2023 09:56 AM   Modules accepted: Orders

## 2023-07-14 ENCOUNTER — Other Ambulatory Visit: Payer: Self-pay | Admitting: Obstetrics and Gynecology

## 2023-07-14 DIAGNOSIS — N809 Endometriosis, unspecified: Secondary | ICD-10-CM

## 2023-08-01 ENCOUNTER — Telehealth: Payer: Self-pay | Admitting: Obstetrics and Gynecology

## 2023-08-01 NOTE — Telephone Encounter (Signed)
 We do not do surgical clearance letters. She will have to get from her PCP, even if she is on hormones.

## 2023-08-01 NOTE — Telephone Encounter (Signed)
 Shelly Middleton, Patient Shelly Middleton called today requesting a letter about medication that you have prescribed.  She is having a breast lift and abdominal plasty.  It is to say that she is ok to have the surgery. She would like to pick the letter up or send it through her MYChart. She need it as soon as she can get it.   Thanks

## 2023-08-02 NOTE — Telephone Encounter (Signed)
Pt aware.

## 2023-08-05 ENCOUNTER — Encounter: Payer: Self-pay | Admitting: Obstetrics and Gynecology

## 2023-09-21 IMAGING — CT CT ABD-PELV W/ CM
2 of 5 series · 16 of 46 positions shown, 18 images · IV contrast (agent unspecified)
Comparison: 03/15/2017.

CLINICAL DATA: Lower abdominal pain and diarrhea for 1 month.

EXAM:
CT ABDOMEN AND PELVIS WITH CONTRAST
TECHNIQUE: Multidetector CT imaging of the abdomen and pelvis was performed
using the standard protocol following bolus administration of
intravenous contrast.

[Series 2: abd pelvis 5.00 · axial · 0.93mm/px · z∈[-1529,-1079]mm · 13 of 102 slices shown, 15 images]
[im 6/102  soft-tissue]
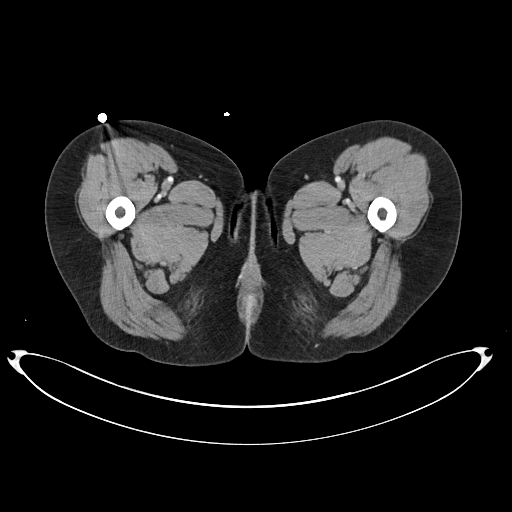
[im 6/102  bone]
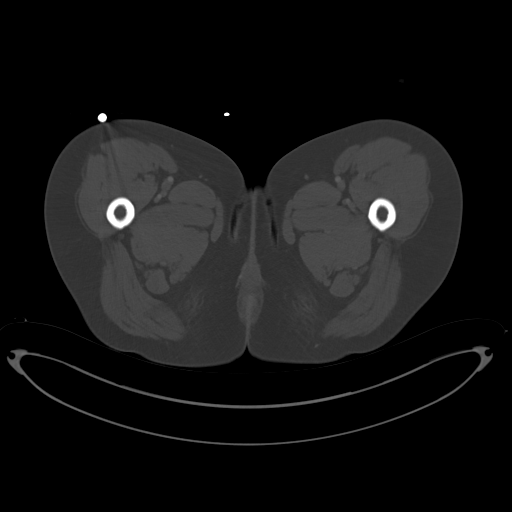
[im 12/102  soft-tissue]
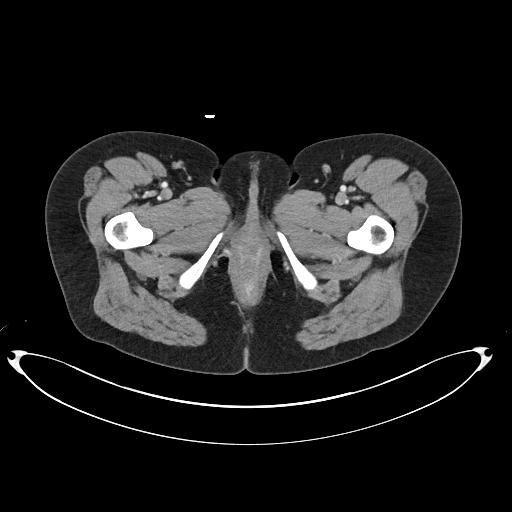
[im 23/102  soft-tissue]
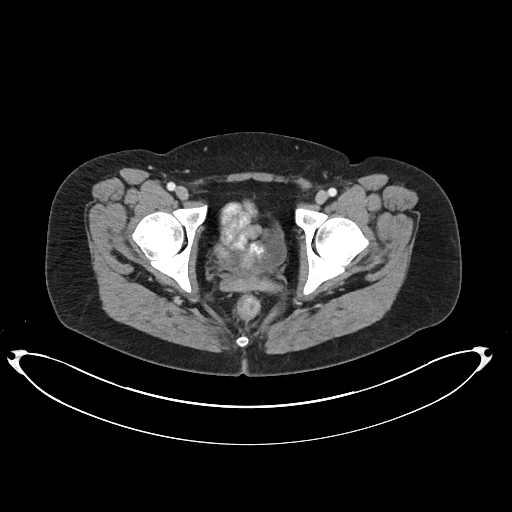
[im 29/102  soft-tissue]
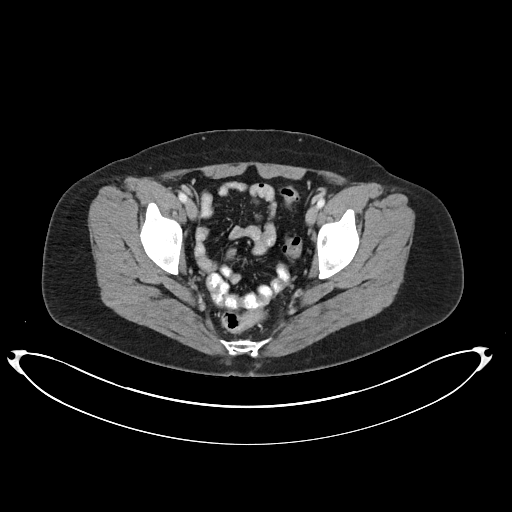
[im 34/102  soft-tissue]
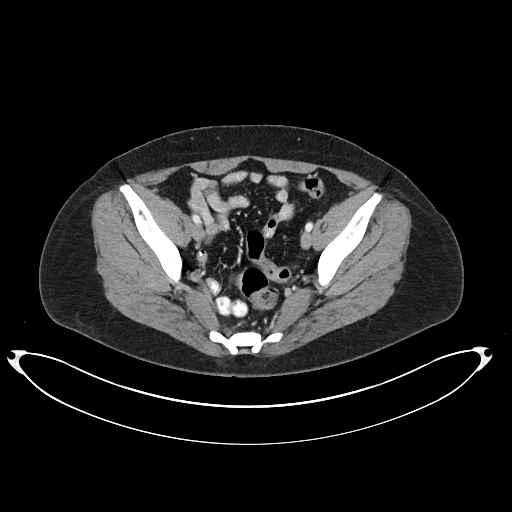
[im 45/102  soft-tissue]
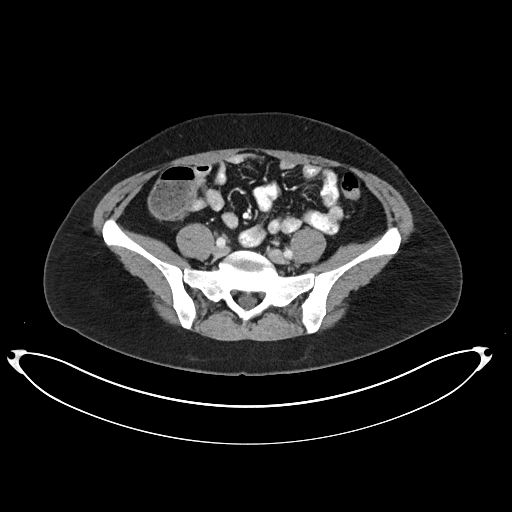
[im 51/102  soft-tissue]
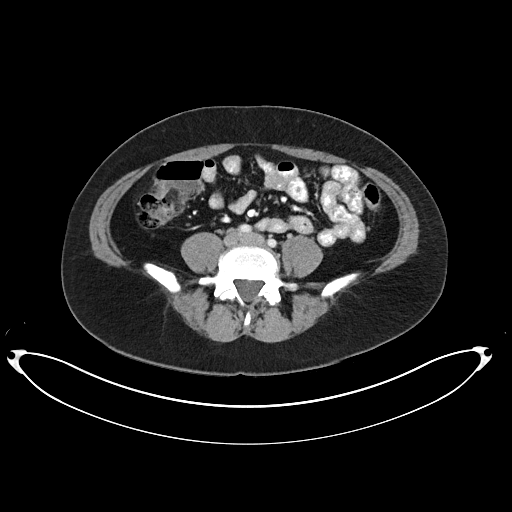
[im 57/102  soft-tissue]
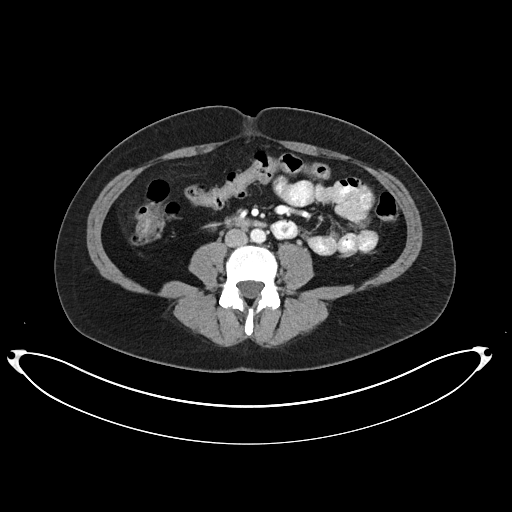
[im 68/102  soft-tissue]
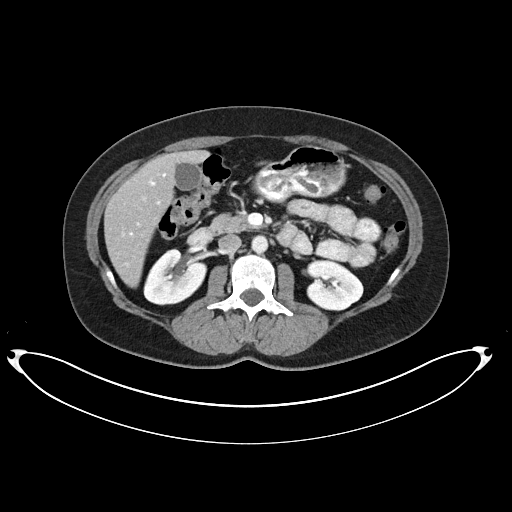
[im 68/102  bone]
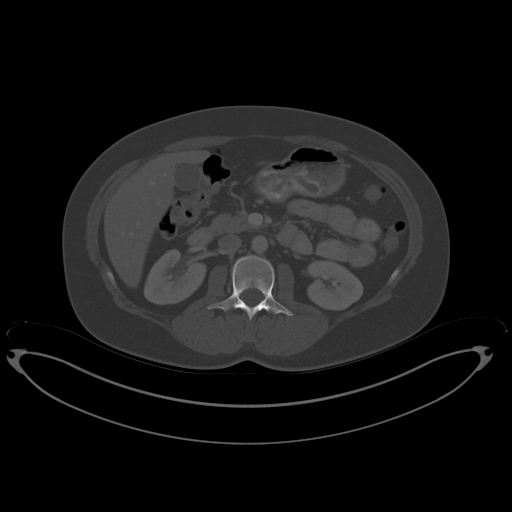
[im 73/102  soft-tissue]
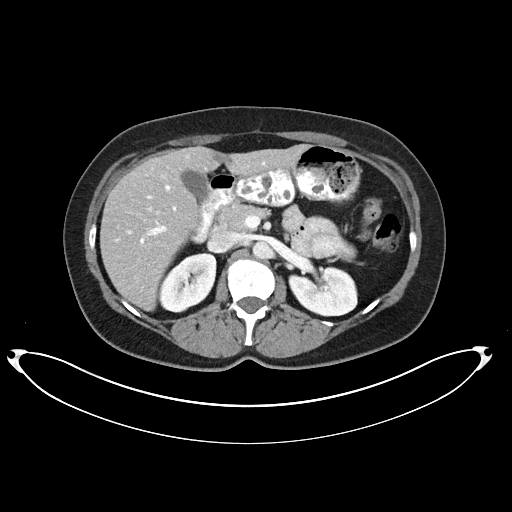
[im 79/102  soft-tissue]
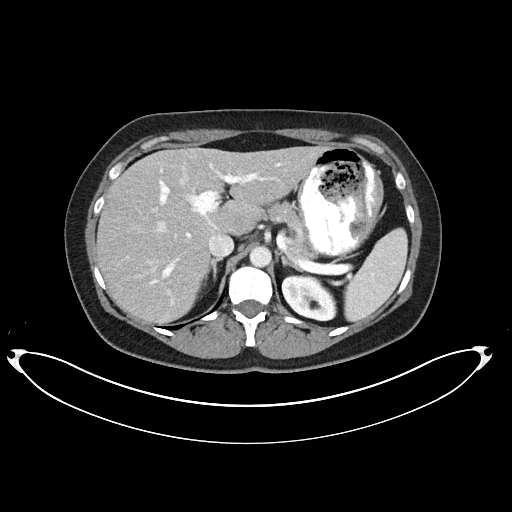
[im 90/102  soft-tissue]
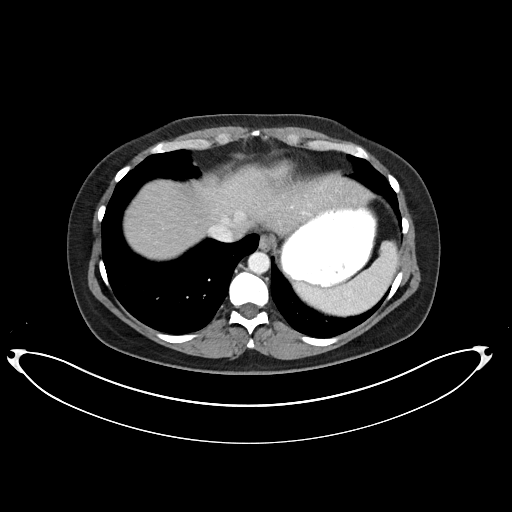
[im 96/102  soft-tissue]
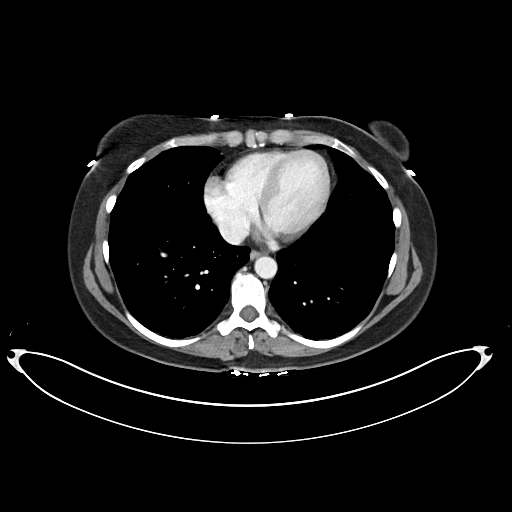

[Series 4: coronals abd pelvis 2.00 cor · coronal · 0.79mm/px · 3 of 131 slices shown]
[im 44/131  soft-tissue]
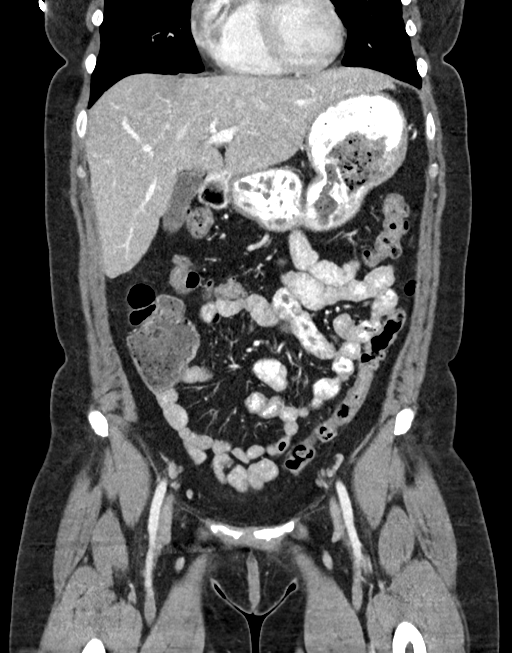
[im 58/131  soft-tissue]
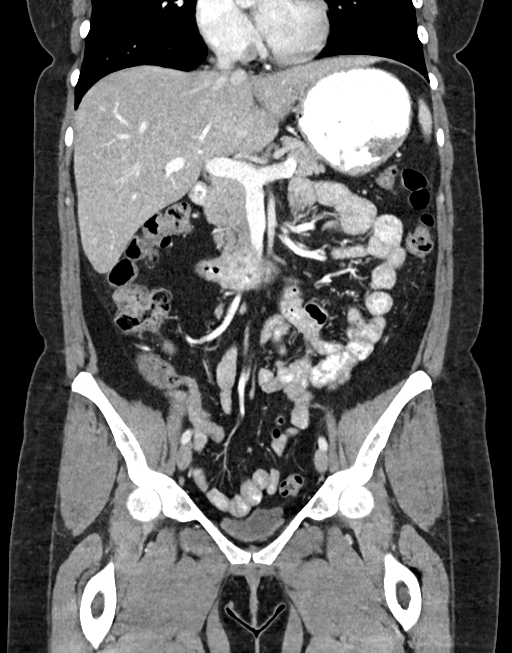
[im 73/131  soft-tissue]
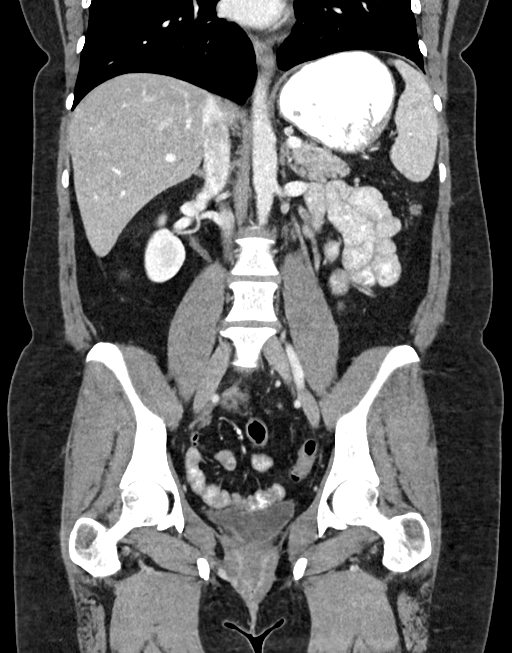

[16 of 46 positions shown; findings below may reference images not displayed]

RADIATION DOSE REDUCTION: This exam was performed according to the
departmental dose-optimization program which includes automated
exposure control, adjustment of the mA and/or kV according to
patient size and/or use of iterative reconstruction technique.

CONTRAST:  100mL OMNIPAQUE IOHEXOL 300 MG/ML  SOLN
FINDINGS: Lower chest: Clear lung bases.

Hepatobiliary: Liver normal size. Decreased liver parenchymal
attenuation. No mass or focal lesion. Normal gallbladder. No bile
duct dilation.

Pancreas: Unremarkable. No pancreatic ductal dilatation or
surrounding inflammatory changes.

Spleen: Normal in size without focal abnormality.

Adrenals/Urinary Tract: Normal adrenal glands. Small fat density
mass, upper pole the left kidney, 9 mm, stable and consistent with
an angiomyolipoma. No other renal masses or lesions, no stones and
no hydronephrosis. Normal ureters. Normal bladder.

Stomach/Bowel: Stomach is within normal limits. Appendix appears
normal. No evidence of bowel wall thickening, distention, or
inflammatory changes.

Vascular/Lymphatic: No significant vascular findings are present. No
enlarged abdominal or pelvic lymph nodes.

Reproductive: Status post hysterectomy. No adnexal masses.

Other: Small fat containing umbilical hernia, stable.  No ascites.

Musculoskeletal: No fracture or acute finding.  No bone lesion.
IMPRESSION: 1. No acute findings. No findings to account for abdominal pain. No
bowel obstruction or inflammation.
2. Hepatic steatosis.
3. Stable small benign left renal angiomyolipoma.

## 2023-10-03 ENCOUNTER — Other Ambulatory Visit: Payer: Self-pay | Admitting: Obstetrics and Gynecology

## 2023-10-03 ENCOUNTER — Encounter: Payer: Self-pay | Admitting: Obstetrics and Gynecology

## 2023-10-03 DIAGNOSIS — N951 Menopausal and female climacteric states: Secondary | ICD-10-CM

## 2023-10-03 NOTE — Telephone Encounter (Signed)
 Called pharmacy and spoke to Dustin. Pt requested RF on old Rx. He stated RF request went through and she should be able to pick it up today. Pt aware. Also advised to ask pharmacy to delete old Rx.

## 2023-10-09 ENCOUNTER — Emergency Department
Admission: EM | Admit: 2023-10-09 | Discharge: 2023-10-09 | Disposition: A | Discharge status: Home / Self Care | Attending: Emergency Medicine | Admitting: Emergency Medicine

## 2023-10-09 ENCOUNTER — Emergency Department

## 2023-10-09 ENCOUNTER — Encounter: Payer: Self-pay | Admitting: Emergency Medicine

## 2023-10-09 ENCOUNTER — Other Ambulatory Visit: Payer: Self-pay

## 2023-10-09 DIAGNOSIS — R1033 Periumbilical pain: Secondary | ICD-10-CM | POA: Insufficient documentation

## 2023-10-09 DIAGNOSIS — G8918 Other acute postprocedural pain: Secondary | ICD-10-CM

## 2023-10-09 LAB — COMPREHENSIVE METABOLIC PANEL WITH GFR
ALT: 14 U/L (ref 0–44)
AST: 15 U/L (ref 15–41)
Albumin: 4.4 g/dL (ref 3.5–5.0)
Alkaline Phosphatase: 42 U/L (ref 38–126)
Anion gap: 9 (ref 5–15)
BUN: 12 mg/dL (ref 6–20)
CO2: 24 mmol/L (ref 22–32)
Calcium: 9.4 mg/dL (ref 8.9–10.3)
Chloride: 108 mmol/L (ref 98–111)
Creatinine, Ser: 0.59 mg/dL (ref 0.44–1.00)
GFR, Estimated: >60 mL/min (ref >60)
Glucose, Bld: 79 mg/dL (ref 70–99)
Potassium: 3.1 mmol/L — ABNORMAL LOW (ref 3.5–5.1)
Sodium: 141 mmol/L (ref 135–145)
Total Bilirubin: 1.1 mg/dL (ref 0.0–1.2)
Total Protein: 7.5 g/dL (ref 6.5–8.1)

## 2023-10-09 LAB — CBC WITH DIFFERENTIAL/PLATELET
Abs Immature Granulocytes: 0.04 K/uL (ref 0.00–0.07)
Basophils Absolute: 0.1 K/uL (ref 0.0–0.1)
Basophils Relative: 1 %
Eosinophils Absolute: 0.1 K/uL (ref 0.0–0.5)
Eosinophils Relative: 2 %
HCT: 41.4 % (ref 36.0–46.0)
Hemoglobin: 14.1 g/dL (ref 12.0–15.0)
Immature Granulocytes: 1 %
Lymphocytes Relative: 20 %
Lymphs Abs: 1.7 K/uL (ref 0.7–4.0)
MCH: 31.1 pg (ref 26.0–34.0)
MCHC: 34.1 g/dL (ref 30.0–36.0)
MCV: 91.4 fL (ref 80.0–100.0)
Monocytes Absolute: 0.5 K/uL (ref 0.1–1.0)
Monocytes Relative: 6 %
Neutro Abs: 6.4 K/uL (ref 1.7–7.7)
Neutrophils Relative %: 70 %
Platelets: 317 K/uL (ref 150–400)
RBC: 4.53 MIL/uL (ref 3.87–5.11)
RDW: 12 % (ref 11.5–15.5)
WBC: 8.8 K/uL (ref 4.0–10.5)
nRBC: 0 % (ref 0.0–0.2)

## 2023-10-09 LAB — URINALYSIS, ROUTINE W REFLEX MICROSCOPIC
Bilirubin Urine: NEGATIVE
Glucose, UA: NEGATIVE mg/dL
Hgb urine dipstick: NEGATIVE
Ketones, ur: NEGATIVE mg/dL
Leukocytes,Ua: NEGATIVE
Nitrite: NEGATIVE
Protein, ur: 30 mg/dL — AB
Specific Gravity, Urine: 1.024 (ref 1.005–1.030)
pH: 7 (ref 5.0–8.0)

## 2023-10-09 LAB — LIPASE, BLOOD: Lipase: 38 U/L (ref 11–51)

## 2023-10-09 MED ORDER — KETOROLAC TROMETHAMINE 30 MG/ML IJ SOLN
30.0000 mg | Freq: Once | INTRAMUSCULAR | Status: AC
  Administered 2023-10-09 (×2): 30 mg via INTRAVENOUS
  Filled 2023-10-09: qty 1

## 2023-10-09 MED ORDER — ONDANSETRON HCL 4 MG/2ML IJ SOLN
4.0000 mg | Freq: Once | INTRAMUSCULAR | Status: AC
  Administered 2023-10-09 (×2): 4 mg via INTRAVENOUS
  Filled 2023-10-09: qty 2

## 2023-10-09 MED ORDER — OXYCODONE-ACETAMINOPHEN 5-325 MG PO TABS: 1.0000 | ORAL_TABLET | Freq: Three times a day (TID) | ORAL | 0 refills | Status: AC | PRN

## 2023-10-09 MED ORDER — MORPHINE SULFATE (PF) 4 MG/ML IV SOLN
4.0000 mg | Freq: Once | INTRAVENOUS | Status: AC
  Administered 2023-10-09 (×2): 4 mg via INTRAVENOUS
  Filled 2023-10-09: qty 1

## 2023-10-09 MED ORDER — FENTANYL CITRATE PF 50 MCG/ML IJ SOSY
50.0000 ug | PREFILLED_SYRINGE | Freq: Once | INTRAMUSCULAR | Status: AC
  Administered 2023-10-09 (×2): 50 ug via INTRAVENOUS
  Filled 2023-10-09: qty 1

## 2023-10-09 MED ORDER — ONDANSETRON 4 MG PO TBDP: 4.0000 mg | ORAL_TABLET | Freq: Three times a day (TID) | ORAL | 0 refills | Status: AC | PRN

## 2023-10-09 MED ORDER — IOHEXOL 300 MG/ML  SOLN
100.0000 mL | Freq: Once | INTRAMUSCULAR | Status: AC | PRN
  Administered 2023-10-09 (×2): 100 mL via INTRAVENOUS

## 2023-10-09 NOTE — ED Notes (Signed)
 Pt calls out that she is nauseated.  Medicated pt and placed cool washcloth on her forehead.

## 2023-10-09 NOTE — ED Provider Triage Note (Signed)
 Emergency Medicine Provider Triage Evaluation Note  ALVARETTA EISENBERGER , a 48 y.o. female  was evaluated in triage.  Pt complains of periumbilical abdominal pain x 1 week s/p tummy tuck in July. She saw Dr. Tye Monday for the same. Pain is worse. No BM since Monday.  Physical Exam  Ht 5' 3 (1.6 m)   Wt 63.5 kg   LMP 03/04/2017 (Exact Date)   BMI 24.80 kg/m  Gen:   Awake, no distress   Resp:  Normal effort  MSK:   Moves extremities without difficulty  Other:    Medical Decision Making  Medically screening exam initiated at 12:31 PM.  Appropriate orders placed.  Zadia JOSETTE SHIMABUKURO was informed that the remainder of the evaluation will be completed by another provider, this initial triage assessment does not replace that evaluation, and the importance of remaining in the ED until their evaluation is complete.    Herlinda Kirk NOVAK, FNP 10/09/23 1238

## 2023-10-09 NOTE — Discharge Instructions (Signed)
 Your exam and CT scan show what may be a very small fat-containing hernia versus there is some postoperative inflammatory changes.  Take prescription meds as needed.  Follow-up with your primary provider or general surgeon for reevaluation.

## 2023-10-09 NOTE — ED Triage Notes (Signed)
 Pt via POV from home. Pt c/o abd pain around her belly button for the past week. States that she had a tummy tuck in July. Reports that she saw Dr. Tye on Monday for same. Denies any NVD. Denies fever. Pt is A&Ox4 and NAD, ambulatory to triage.

## 2023-10-09 NOTE — ED Provider Notes (Signed)
 ----------------------------------------- 3:21 PM on 10/09/2023 -----------------------------------------  Blood pressure (!) 169/102, pulse 73, temperature 98.4 F (36.9 C), temperature source Oral, resp. rate 18, height 5' 3 (1.6 m), weight 63.5 kg, last menstrual period 03/04/2017, SpO2 100%.  Assuming care from Dr. Jenna Poggi, PA-C/NP-C.  In short, Shelly Middleton is a 48 y.o. female with a chief complaint of Abdominal Pain .  Refer to the original H&P for additional details.  The current plan of care is to await pending CT contrast study and disposition the patient accordingly..  ____________________________________________    ED Results / Procedures / Treatments   Labs (all labs ordered are listed, but only abnormal results are displayed) Labs Reviewed  COMPREHENSIVE METABOLIC PANEL WITH GFR - Abnormal; Notable for the following components:      Result Value   Potassium 3.1 (*)    All other components within normal limits  URINALYSIS, ROUTINE W REFLEX MICROSCOPIC - Abnormal; Notable for the following components:   Color, Urine YELLOW (*)    APPearance HAZY (*)    Protein, ur 30 (*)    Bacteria, UA MANY (*)    All other components within normal limits  CBC WITH DIFFERENTIAL/PLATELET  LIPASE, BLOOD    EKG   RADIOLOGY  I personally viewed and evaluated these images as part of my medical decision making, as well as reviewing the written report by the radiologist.  ED Provider Interpretation: Fat-containing umbilical hernia with postsurgical changes at the Periumbilicus  CT ABDOMEN PELVIS W CONTRAST Result Date: 10/09/2023 CLINICAL DATA:  Abdominal pain in the region of the umbilicus for the past week. Status post tummy tuck in July 2025. EXAM: CT ABDOMEN AND PELVIS WITH CONTRAST TECHNIQUE: Multidetector CT imaging of the abdomen and pelvis was performed using the standard protocol following bolus administration of intravenous contrast. RADIATION DOSE REDUCTION: This exam  was performed according to the departmental dose-optimization program which includes automated exposure control, adjustment of the mA and/or kV according to patient size and/or use of iterative reconstruction technique. CONTRAST:  OMNIPAQUE  IOHEXOL  300 MG/ML  SOLN COMPARISON:  06/13/2021 FINDINGS: Lower chest: Normal-sized heart. Minimal right basilar dependent atelectasis. Hepatobiliary: No focal liver abnormality is seen. No gallstones, gallbladder wall thickening, or biliary dilatation. Pancreas: Unremarkable. No pancreatic ductal dilatation or surrounding inflammatory changes. Spleen: Normal in size without focal abnormality. Adrenals/Urinary Tract: 1.3 cm fat density mass in the upper pole of the left kidney, previously 1.2 cm in corresponding diameter. Normal-appearing adrenal glands, right kidney, ureters and urinary bladder. Stomach/Bowel: Stomach is within normal limits. Appendix appears normal. No evidence of bowel wall thickening, distention, or inflammatory changes. Vascular/Lymphatic: No significant vascular findings are present. No enlarged abdominal or pelvic lymph nodes. Reproductive: Status post hysterectomy. No adnexal masses. Other: Postsurgical scarring anterior to the anterior abdominal wall. Small fat containing umbilical hernia. Small, tubular extension of fat between the medial aspects of the rectus abdominus muscles inferior to the umbilicus with a mildly thickened diffusely enhancing surrounding rim. Smaller similar-appearing area superior to the umbilicus without significant wall thickening or enhancement. Musculoskeletal: Mild lumbar spine degenerative changes. IMPRESSION: 1. Postsurgical changes involving the anterior abdominal wall and overlying fat, as described above. 2. Small fat containing umbilical hernia. 3. 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 Electronically Signed   By: Elspeth Bathe M.D.   On: 10/09/2023 16:13    PROCEDURES:  Critical Care performed:  No  Procedures   MEDICATIONS ORDERED IN ED: Medications  morphine  (PF) 4 MG/ML injection 4 mg (4 mg  Intravenous Given 10/09/23 1335)  iohexol  (OMNIPAQUE ) 300 MG/ML solution 100 mL (100 mLs Intravenous Contrast Given 10/09/23 1427)  fentaNYL  (SUBLIMAZE ) injection 50 mcg (50 mcg Intravenous Given 10/09/23 1637)  ondansetron  (ZOFRAN ) injection 4 mg (4 mg Intravenous Given 10/09/23 1658)  ketorolac  (TORADOL ) 30 MG/ML injection 30 mg (30 mg Intravenous Given 10/09/23 1733)     IMPRESSION / MDM / ASSESSMENT AND PLAN / ED COURSE  I reviewed the triage vital signs and the nursing notes.                              Differential diagnosis includes, but is not limited to, abscess, seroma, dehiscence, hernia  Patient's presentation is most consistent with acute complicated illness / injury requiring diagnostic workup.  ----------------------------------------- 5:21 PM on 10/09/2023 ----------------------------------------- S/W Dr. Derrill regarding the case.  He reviewed the CT images with, and does not see any acute or emergent findings to suggest an indication for admission or surgical intervention.  He would advise the patient be discharged with pain medicines follow-up with the GP or general surgeon in the outpatient setting.  Patient's diagnosis is consistent with abdominal pain secondary to periumbilical hernia versus postsurgical changes.  CT confirms some postsurgical inflammatory changes as well as a possible fat-containing umbilical hernia, based on my interpretation of images.  Case was discussed with general surgery on-call Dr. Thelbert, who saw no emergent need for intervention or admission.  Patient will be discharged home with prescriptions for oxycodone  and Zofran . Patient is to follow up with general surgery or her GP as suggested, as needed or otherwise directed. Patient is given ED precautions to return to the ED for any worsening or new symptoms.  Clinical Course as of 10/09/23  2224  Wed Oct 09, 2023  1525 Passed off to [employer] [JP]    Clinical Course User Index [JP] Poggi, Jenna E, PA-C    FINAL CLINICAL IMPRESSION(S) / ED DIAGNOSES   Final diagnoses:  Periumbilical abdominal pain  Post-op pain     Rx / DC Orders   ED Discharge Orders          Ordered    ondansetron  (ZOFRAN -ODT) 4 MG disintegrating tablet  Every 8 hours PRN        10/09/23 1719    oxyCODONE -acetaminophen  (PERCOCET) 5-325 MG tablet  Every 8 hours PRN        10/09/23 1719             Note:  This document was prepared using Dragon voice recognition software and may include unintentional dictation errors.    Loyd Candida LULLA Aldona, PA-C 10/09/23 2226    Suzanne Kirsch, MD 10/12/23 1606

## 2023-10-09 NOTE — Group Note (Deleted)
 Date:  10/09/2023 Time:  2:22 PM  Group Topic/Focus:  Wellness Toolbox:   The focus of this group is to discuss various aspects of wellness, balancing those aspects and exploring ways to increase the ability to experience wellness.  Patients will create a wellness toolbox for use upon discharge.     Participation Level:  {BHH PARTICIPATION OZCZO:77735}  Participation Quality:  {BHH PARTICIPATION QUALITY:22265}  Affect:  {BHH AFFECT:22266}  Cognitive:  {BHH COGNITIVE:22267}  Insight: {BHH Insight2:20797}  Engagement in Group:  {BHH ENGAGEMENT IN HMNLE:77731}  Modes of Intervention:  {BHH MODES OF INTERVENTION:22269}  Additional Comments:  ***  Myra Curtistine BROCKS 10/09/2023, 2:22 PM

## 2023-10-09 NOTE — ED Provider Notes (Signed)
 [facility] Provider Note    Event Date/Time   First MD Initiated Contact with Patient 10/09/23 1313     (approximate)   History   Abdominal Pain   HPI  Shelly Middleton is a 48 y.o. female who presents today for evaluation of abdominal pain.  Patient reports that she had a tummy tuck done in [place] on 08/27/2023 and had her tubes pulled by Dr. Tye on 09/11/2023 and had been doing well until the past week.  She reports that she began to have intermittent periumbilical pain that became constant 2 days ago.  She went to see Dr. Tye 2 days ago and was told that she had gastroenteritis.  She reports that her pain has worsened since then.  She has not had any bowel movement since she took Imodium on Monday.  She has not had any nausea or vomiting.  No fevers or chills.  She reports that she has not had much of an appetite since this pain began.  She reports that her pain is present with movement as well as with laying still. No other abdominal surgeries.  Patient Active Problem List   Diagnosis Date Noted   Menopausal syndrome (hot flashes) 12/25/2021   Right groin pain 01/30/2018   Post-operative state 03/11/2017   Abnormal CT scan, colon    Abnormal abdominal CT scan    Abdominal pain 10/31/2016   Abdominal pain, RLQ           Physical Exam   Triage Vital Signs: ED Triage Vitals  Encounter Vitals Group     BP 10/09/23 1232 (!) 169/102     Girls Systolic BP Percentile --      Girls Diastolic BP Percentile --      Boys Systolic BP Percentile --      Boys Diastolic BP Percentile --      Pulse Rate 10/09/23 1232 73     Resp 10/09/23 1232 18     Temp 10/09/23 1232 98.4 F (36.9 C)     Temp Source 10/09/23 1232 Oral     SpO2 10/09/23 1232 100 %     Weight 10/09/23 1228 140 lb (63.5 kg)     Height 10/09/23 1228 5' 3 (1.6 m)     Head Circumference --      Peak Flow --      Pain Score 10/09/23 1227 8     Pain Loc --      Pain Education --       Exclude from Growth Chart --     Most recent vital signs: Vitals:   10/09/23 1232  BP: (!) 169/102  Pulse: 73  Resp: 18  Temp: 98.4 F (36.9 C)  SpO2: 100%    Physical Exam Vitals and nursing note reviewed.  Constitutional:      General: Awake and alert. No acute distress.    Appearance: Normal appearance. The patient is normal weight.  HENT:     Head: Normocephalic and atraumatic.     Mouth: Mucous membranes are moist.  Eyes:     General: PERRL. Normal EOMs        Right eye: No discharge.        Left eye: No discharge.     Conjunctiva/sclera: Conjunctivae normal.  Cardiovascular:     Rate and Rhythm: Normal rate and regular rhythm.     Pulses: Normal pulses.  Pulmonary:     Effort: Pulmonary effort is normal. No respiratory distress.  Breath sounds: Normal breath sounds.  Abdominal:     Abdomen is soft. There is left-sided periumbilical abdominal tenderness. No rebound or guarding. No distention.  Well-healed surgical incision to low transverse area of abdomen without tenderness to palpation Musculoskeletal:        General: No swelling. Normal range of motion.     Cervical back: Normal range of motion and neck supple.  Skin:    General: Skin is warm and dry.     Capillary Refill: Capillary refill takes less than 2 seconds.     Findings: No rash.  Neurological:     Mental Status: The patient is awake and alert.      ED Results / Procedures / Treatments   Labs (all labs ordered are listed, but only abnormal results are displayed) Labs Reviewed  COMPREHENSIVE METABOLIC PANEL WITH GFR - Abnormal; Notable for the following components:      Result Value   Potassium 3.1 (*)    All other components within normal limits  URINALYSIS, ROUTINE W REFLEX MICROSCOPIC - Abnormal; Notable for the following components:   Color, Urine YELLOW (*)    APPearance HAZY (*)    Protein, ur 30 (*)    Bacteria, UA MANY (*)    All other components within normal limits  CBC WITH  DIFFERENTIAL/PLATELET  LIPASE, BLOOD     EKG     RADIOLOGY     PROCEDURES:  Critical Care performed:   Procedures   MEDICATIONS ORDERED IN ED: Medications  morphine  (PF) 4 MG/ML injection 4 mg (4 mg Intravenous Given 10/09/23 1335)  iohexol  (OMNIPAQUE ) 300 MG/ML solution 100 mL (100 mLs Intravenous Contrast Given 10/09/23 1427)     IMPRESSION / MDM / ASSESSMENT AND PLAN / ED COURSE  I reviewed the triage vital signs and the nursing notes.   Differential diagnosis includes, but is not limited to, postoperative complication, hernia, seroma, hematoma, infection, appendicitis, diverticulitis, gastroenteritis.  Patient is awake and alert, hemodynamically stable and afebrile.  Labs obtained overall reassuring.  Stable H&H, no leukocytosis, normal LFTs and lipase.  I reviewed the patient's chart.  Patient saw Dr. Tye on 10/07/2023 who felt that exam was consistent with gastroenteritis.  I recommended CT scan for further evaluation especially in her postoperative state.  Patient was treated symptomatically with morphine  with good effect.  Passed off to [employer] pending CT scan and final disposition.  Patient's presentation is most consistent with acute presentation with potential threat to life or bodily function.   Clinical Course as of 10/09/23 1527  Wed Oct 09, 2023  1525 Passed off to [employer] [JP]    Clinical Course User Index [JP] Lynise Porr E, PA-C     FINAL CLINICAL IMPRESSION(S) / ED DIAGNOSES   Final diagnoses:  Periumbilical abdominal pain     Rx / DC Orders   ED Discharge Orders     None        Note:  This document was prepared using Dragon voice recognition software and may include unintentional dictation errors.   Lynette Topete E, PA-C 10/09/23 1527    Levander Slate, MD 10/09/23 [phone]

## 2024-03-27 ENCOUNTER — Other Ambulatory Visit: Payer: Self-pay | Admitting: Chiropractor

## 2024-03-27 ENCOUNTER — Ambulatory Visit
Admission: RE | Admit: 2024-03-27 | Discharge: 2024-03-27 | Disposition: A | Source: Ambulatory Visit | Attending: Chiropractor | Admitting: Chiropractor

## 2024-03-27 DIAGNOSIS — S29019A Strain of muscle and tendon of unspecified wall of thorax, initial encounter: Secondary | ICD-10-CM

## 2024-03-27 DIAGNOSIS — S39012A Strain of muscle, fascia and tendon of lower back, initial encounter: Secondary | ICD-10-CM

## 2024-03-27 DIAGNOSIS — S161XXA Strain of muscle, fascia and tendon at neck level, initial encounter: Secondary | ICD-10-CM
